# Patient Record
Sex: Female | Born: 1940 | Race: Black or African American | Hispanic: No | Smoking: Never smoker
Health system: Southern US, Community
[De-identification: ages and names within clinical notes are randomized; demographics above are authoritative.]

## PROBLEM LIST (undated history)

## (undated) DIAGNOSIS — I10 Essential (primary) hypertension: Secondary | ICD-10-CM

## (undated) DIAGNOSIS — E7800 Pure hypercholesterolemia, unspecified: Secondary | ICD-10-CM

## (undated) DIAGNOSIS — E785 Hyperlipidemia, unspecified: Secondary | ICD-10-CM

## (undated) DIAGNOSIS — J45909 Unspecified asthma, uncomplicated: Secondary | ICD-10-CM

## (undated) DIAGNOSIS — K219 Gastro-esophageal reflux disease without esophagitis: Secondary | ICD-10-CM

## (undated) DIAGNOSIS — E119 Type 2 diabetes mellitus without complications: Secondary | ICD-10-CM

## (undated) DIAGNOSIS — M1990 Unspecified osteoarthritis, unspecified site: Secondary | ICD-10-CM

## (undated) HISTORY — PX: OTHER SURGICAL HISTORY: SHX169

---

## 2005-04-28 ENCOUNTER — Ambulatory Visit: Payer: Self-pay | Admitting: Family Medicine

## 2007-05-11 ENCOUNTER — Ambulatory Visit: Payer: Self-pay | Admitting: Gastroenterology

## 2007-11-22 ENCOUNTER — Ambulatory Visit: Payer: Self-pay | Admitting: Family Medicine

## 2008-03-02 ENCOUNTER — Ambulatory Visit: Payer: Self-pay | Admitting: Family Medicine

## 2009-03-27 ENCOUNTER — Ambulatory Visit: Payer: Self-pay | Admitting: Family Medicine

## 2011-08-26 ENCOUNTER — Observation Stay: Payer: Self-pay | Admitting: Internal Medicine

## 2011-08-26 LAB — CK TOTAL AND CKMB (NOT AT ARMC)
CK, Total: 61 U/L (ref 21–215)
CK, Total: 46 U/L (ref 21–215)
CK-MB: <0.5 ng/mL — ABNORMAL LOW (ref 0.5–3.6)
CK-MB: <0.5 ng/mL — ABNORMAL LOW (ref 0.5–3.6)

## 2011-08-26 LAB — TSH: Thyroid Stimulating Horm: 2.62 u[IU]/mL

## 2011-08-26 LAB — CBC
HCT: 41 % (ref 35.0–47.0)
HGB: 13.5 g/dL (ref 12.0–16.0)
MCH: 28 pg (ref 26.0–34.0)
MCHC: 33 g/dL (ref 32.0–36.0)
RBC: 4.82 10*6/uL (ref 3.80–5.20)
WBC: 9.3 10*3/uL (ref 3.6–11.0)

## 2011-08-26 LAB — BASIC METABOLIC PANEL
Calcium, Total: 9.1 mg/dL (ref 8.5–10.1)
Chloride: 102 mmol/L (ref 98–107)
Creatinine: 0.66 mg/dL (ref 0.60–1.30)
EGFR (Non-African Amer.): >60
Glucose: 118 mg/dL — ABNORMAL HIGH (ref 65–99)
Osmolality: 279 (ref 275–301)
Potassium: 3.4 mmol/L — ABNORMAL LOW (ref 3.5–5.1)
Sodium: 139 mmol/L (ref 136–145)

## 2011-08-26 LAB — TROPONIN I: Troponin-I: <0.02 ng/mL

## 2011-08-27 LAB — CBC WITH DIFFERENTIAL/PLATELET
Basophil #: 0 10*3/uL (ref 0.0–0.1)
Eosinophil %: 2.1 %
Lymphocyte %: 34.8 %
MCH: 27.8 pg (ref 26.0–34.0)
MCV: 85 fL (ref 80–100)
Monocyte #: 0.6 10*3/uL (ref 0.0–0.7)
Monocyte %: 6.9 %
Neutrophil %: 55.7 %
Platelet: 210 10*3/uL (ref 150–440)
RBC: 4.36 10*6/uL (ref 3.80–5.20)
RDW: 13 % (ref 11.5–14.5)
WBC: 8 10*3/uL (ref 3.6–11.0)

## 2011-08-27 LAB — LIPID PANEL
Cholesterol: 182 mg/dL (ref 0–200)
Ldl Cholesterol, Calc: 124 mg/dL — ABNORMAL HIGH (ref 0–100)

## 2011-08-27 LAB — BASIC METABOLIC PANEL
Anion Gap: 13 (ref 7–16)
Calcium, Total: 9 mg/dL (ref 8.5–10.1)
Co2: 29 mmol/L (ref 21–32)
Creatinine: 0.55 mg/dL — ABNORMAL LOW (ref 0.60–1.30)
EGFR (Non-African Amer.): >60
Glucose: 95 mg/dL (ref 65–99)
Potassium: 3.6 mmol/L (ref 3.5–5.1)
Sodium: 145 mmol/L (ref 136–145)

## 2011-08-27 LAB — TROPONIN I: Troponin-I: <0.02 ng/mL

## 2011-08-27 LAB — CK TOTAL AND CKMB (NOT AT ARMC): CK-MB: <0.5 ng/mL — ABNORMAL LOW (ref 0.5–3.6)

## 2011-08-28 LAB — BASIC METABOLIC PANEL
BUN: 13 mg/dL (ref 7–18)
Calcium, Total: 9.2 mg/dL (ref 8.5–10.1)
Creatinine: 0.62 mg/dL (ref 0.60–1.30)
EGFR (African American): >60
EGFR (Non-African Amer.): >60
Glucose: 133 mg/dL — ABNORMAL HIGH (ref 65–99)
Potassium: 3.6 mmol/L (ref 3.5–5.1)
Sodium: 144 mmol/L (ref 136–145)

## 2011-08-28 LAB — CBC WITH DIFFERENTIAL/PLATELET
Basophil %: 0.4 %
Eosinophil #: 0.2 10*3/uL (ref 0.0–0.7)
Eosinophil %: 2.2 %
HCT: 38 % (ref 35.0–47.0)
HGB: 12.4 g/dL (ref 12.0–16.0)
MCH: 27.8 pg (ref 26.0–34.0)
MCHC: 32.7 g/dL (ref 32.0–36.0)
MCV: 85 fL (ref 80–100)
Monocyte #: 0.6 10*3/uL (ref 0.0–0.7)
Neutrophil #: 4.7 10*3/uL (ref 1.4–6.5)
Neutrophil %: 55.6 %

## 2011-08-28 LAB — PROTIME-INR
INR: 1.1
Prothrombin Time: 14.1 secs (ref 11.5–14.7)

## 2011-08-28 LAB — HEMOGLOBIN A1C: Hemoglobin A1C: 9.1 % — ABNORMAL HIGH (ref 4.2–6.3)

## 2013-08-18 ENCOUNTER — Emergency Department: Payer: Self-pay | Admitting: Emergency Medicine

## 2013-08-18 LAB — COMPREHENSIVE METABOLIC PANEL
ALT: 18 U/L (ref 12–78)
ANION GAP: 6 — AB (ref 7–16)
Albumin: 3.5 g/dL (ref 3.4–5.0)
Alkaline Phosphatase: 103 U/L
BILIRUBIN TOTAL: 0.3 mg/dL (ref 0.2–1.0)
BUN: 12 mg/dL (ref 7–18)
CREATININE: 0.82 mg/dL (ref 0.60–1.30)
Calcium, Total: 8.8 mg/dL (ref 8.5–10.1)
Chloride: 101 mmol/L (ref 98–107)
Co2: 27 mmol/L (ref 21–32)
EGFR (African American): >60
EGFR (Non-African Amer.): >60
Glucose: 374 mg/dL — ABNORMAL HIGH (ref 65–99)
OSMOLALITY: 283 (ref 275–301)
Potassium: 3.8 mmol/L (ref 3.5–5.1)
SGOT(AST): 20 U/L (ref 15–37)
Sodium: 134 mmol/L — ABNORMAL LOW (ref 136–145)
TOTAL PROTEIN: 7.4 g/dL (ref 6.4–8.2)

## 2013-08-18 LAB — CBC
HCT: 42 % (ref 35.0–47.0)
HGB: 14.2 g/dL (ref 12.0–16.0)
MCH: 28 pg (ref 26.0–34.0)
MCHC: 33.8 g/dL (ref 32.0–36.0)
MCV: 83 fL (ref 80–100)
Platelet: 227 10*3/uL (ref 150–440)
RBC: 5.06 10*6/uL (ref 3.80–5.20)
RDW: 12.8 % (ref 11.5–14.5)
WBC: 8.4 10*3/uL (ref 3.6–11.0)

## 2013-12-09 ENCOUNTER — Emergency Department: Payer: Self-pay | Admitting: Emergency Medicine

## 2014-02-06 DIAGNOSIS — I10 Essential (primary) hypertension: Secondary | ICD-10-CM | POA: Insufficient documentation

## 2014-07-29 ENCOUNTER — Emergency Department: Payer: Self-pay | Admitting: Student

## 2014-08-09 DIAGNOSIS — N611 Abscess of the breast and nipple: Secondary | ICD-10-CM | POA: Insufficient documentation

## 2014-12-03 NOTE — H&P (Signed)
PATIENT NAME:  Mary Hodges, Mary Hodges MR#:  [id number] DATE OF BIRTH:  June 22, 1941  DATE OF ADMISSION:  08/26/2011  PRIMARY MD: Alonna Buckler, MD   ED REFERRING DOCTOR: Dr. Marilynne Halsted   CHIEF COMPLAINT: Chest pressure, shortness of breath.   HISTORY OF PRESENT ILLNESS: The patient is a 74 year old African American female with history of asthma, hypertension, type II diabetes, GERD, and history of hyperlipidemia who states that she started having nausea and vomiting since last night and developed chest tightness since this morning. She reports that initially it was pretty severe. She could not breathe, had to use her inhaler. The chest tightness continued. Inhaler did help with her breathing. She came to the ED. In the ER, the patient's EKG does not show any acute changes. Her cardiac enzymes are negative. She also reports that she has had feeling of having fluttering in her chest that started also this morning. Again, we have not seen anything on the telemetry to suggest any type of arrhythmia. She has not had any radiation of the pain. She does have chronic lower extremity swelling. She reports that over the last two years she has had to keep her head propped up at nighttime because if she slept without her head being propped she would get short of breath. She also has a history of reflux and that she reports that the reflux was causing her to choke and that was another reason that she keeps her bed propped up. She also complains of swelling. In the ED, she had a chest x-ray which suggested possible interstitial edema. She also was nauseated and had vomiting. She reports multiple episodes since yesterday. No diarrhea. Has not had any sick contacts or any unusual food consumption. She, otherwise, denies any abdominal pain. No hematemesis. No hematochezia. Denies any urinary frequency, urgency, or hesitancy.   PAST MEDICAL HISTORY:  1. Asthma.  2. Hypertension.  3. Type II diabetes.  4. Gastroesophageal reflux disease.   5. History of hyperlipidemia, not on any treatment.   PAST SURGICAL HISTORY: Left toe surgery.   ALLERGIES: None.   CURRENT MEDICATIONS:  1. Amlodipine 10 daily.  2. Aspirin 81 1 tab daily.  3. Calcium citrate daily.  4. Glyburide/metformin 5/500 1 tab p.o. b.i.d.  5. Hydrochlorothiazide/losartan 25/160 daily.  6. Januvia 100 daily. 7. Levemir FlexPen subcutaneous. 8. Metformin 1000 1 tab p.o. b.i.d.  9. Nexium 40 daily.  10. Orphenadrine 100 mg 1 tab p.o. b.i.d.  11. Vitamin B complex 100 daily.  12. Albuterol MDI 2 to 4 puffs as needed.   SOCIAL HISTORY: No smoking. No alcohol. No drugs.   FAMILY HISTORY: Father with coronary artery disease. Mother with cerebrovascular accident.   REVIEW OF SYSTEMS: CONSTITUTIONAL: Denies any fevers. Complains of some fatigue and weakness. Denies any pain. No weight loss. No weight gain. EYES: No blurred or double vision. No pain. No redness. No inflammation. No glaucoma or cataracts. ENT: No tinnitus. No ear pain. No hearing loss. No allergies, seasonal or year-round. No postnasal drip. No sinus pain. No difficulty with swallowing. RESPIRATORY: Does have dry cough, intermittent wheezing. No hemoptysis. Has dyspnea on exertion. No asthma. No painful respirations. No chronic obstructive pulmonary disease. No TB. No pneumonia. CARDIOVASCULAR: Chest pressure as above. Endorses some symptoms of orthopnea. Has edema. No arrhythmias but complains of irregular heartbeat since today with fluttering feeling. No syncope. Has high blood pressure. GI: Nausea and vomiting since yesterday has improved. No abdominal pain. No hematemesis. No melena. No ulcer. Does have  gastroesophageal reflux disease. No irritable bowel syndrome. No jaundice. No changes in bowel habits. GU: Denies any dysuria, hematuria, renal calculi, frequency, or incontinence. ENDOCRINE: Denies any polyuria, nocturia, or thyroid problems. HEME/LYMPH: Denies anemia, easy bruisability or bleeding. SKIN:  No acne. No rash. No changes in mole, hair or skin. MUSCULOSKELETAL: No pain in neck, back or shoulder. NEUROLOGIC: No numbness. No cerebrovascular accident. No transient ischemic attack. No seizures. PSYCHIATRIC: No anxiety. No insomnia. No ADD.    PHYSICAL EXAMINATION:   VITAL SIGNS: Temperature 97.5, pulse 92, respiratory rate 20, blood pressure 185/67 on presentation, 99% on room air.   GENERAL: The patient is a morbidly obese African American female currently not in any acute distress.   HEENT: Head atraumatic, normocephalic. Pupils equal, round, reactive to light and accommodation. Extraocular movements intact. There is no scleral icterus. Conjunctivae pallor. Oropharynx is clear without any exudates. Ear exam shows no drainage or erythema. Nasal exam shows no drainage or ulceration.   NECK: There is no thyromegaly. Unable to appreciate JVD due to her thick neck.   CARDIOVASCULAR: Regular rate and rhythm. No murmurs, rubs, clicks, or gallops. PMI is not displaced.   LUNGS: Diminished breath sounds. I do not appreciate any crackles, rales, or wheezing.   ABDOMEN: Obese, soft, nontender, nondistended. Positive bowel sounds x4. There is no hepatosplenomegaly.   EXTREMITIES: She has 1+ edema.   SKIN: There is no rash.   LYMPHATICS: No lymph nodes palpable.   MUSCULOSKELETAL: There is no erythema or swelling.   NEUROLOGICAL: Awake, alert, oriented x3. No focal deficits.   VASCULAR: Good DP, PT pulses.   LYMPH: No lymph nodes palpable.   PSYCHIATRIC: Not anxious or depressed.   EVALUATIONS IN THE ED: WBC 9.3, hemoglobin 13.5, platelets 235, glucose 118, BUN 13, creatinine 0.66, sodium 139, potassium slightly low at 3.4, chloride 102. Troponin less than 0.02. CPK 61. CK-MB less than 0.05. Chest x-ray shows possible bilateral interstitial edema. EKG shows normal sinus rhythm without any ST-T wave changes   ASSESSMENT AND PLAN: The patient is a 74 year old African American female  with history of hypertension, diabetes, and hyperlipidemia who presents with chest pressure since this a.m., heart skipping beats, fluttering feeling, and shortness of breath.  1. Chest pressure associated with palpitations, possibly cardiac. The patient does have multiple risk factors. Will go ahead and get a Lexi MIBI in the a.m. Will check a D-dimer. Check serial enzymes. Place her on aspirin. I will start her on Lopressor b.i.d. I'll have Cardiology come and evaluate the patient in the morning.  2. Shortness of breath possibly due to CHF based on chest x-ray. At this time will order a BNP. Will place her on low dose IV Lasix. Will get echocardiogram and do a PA and lateral chest x-ray in the a.m. Follow ins and outs.  3. Nausea and vomiting possibly related to #1, possible gastroenteritis. Will monitor for symptoms.  4. Diabetes type II. Continue sliding scale insulin, Levemir, and p.o. medications.  5. Hypertension. Will continue her Diovan. Hold HCTZ in light of the Lasix. I will start her on metoprolol. Will hold amlodipine for the time being.  6. Will place her on Lovenox for DVT prophylaxis.   TIME SPENT: Please note 40 minutes spent on this H and P.  ____________________________ Lacie ScottsShreyang H. Allena KatzPatel, MD shp:drc D: 08/26/2011 16:37:49 ET T: 08/26/2011 17:04:05 ET JOB#: [id number]  cc: Lindel Marcell H. Allena KatzPatel, MD, <Dictator> Reola MosherAndrew S. Randa LynnLamb, MD Charise CarwinSHREYANG H Margot Oriordan MD ELECTRONICALLY SIGNED 09/13/2011 8:14

## 2014-12-03 NOTE — Discharge Summary (Signed)
PATIENT NAME:  Mary Hodges, Mary Hodges MR#:  [id number] DATE OF BIRTH:  March 23, 1941  DATE OF ADMISSION:  08/27/2011 DATE OF DISCHARGE:  08/28/2011  ADMITTING DIAGNOSIS: Chest pain.   DISCHARGE DIAGNOSES:  1. Chest pain, status post Myoview revealing apical ischemia. Normal echocardiogram status post cardiac catheterization 08/28/2011 by Dr. Juliann Pares, which showed minimal coronary artery disease, normal renal arteries.  2. Dyspnea, resolved.  3. Diabetes mellitus. 4. Hypertension.   DISCHARGE CONDITION: Stable.   DISCHARGE MEDICATIONS: The patient is to resume her outpatient medications which are:  1. Amlodipine 10 mg p.o. daily.  2. Januvia 100 mg p.o. daily.  3. Aspirin 81 mg p.o. daily.  4. Nexium 40 mg p.o. daily.  5. Vitamin D complex once daily.  6. Metformin 1 gram twice daily. The patient, however, is to hold all medications with metformin for 48 hours after cardiac catheterization procedure, then resume at regular doses. She is to resume her metformin in 48 hours at 1 gram twice daily.  7. Glyburide metformin 5/500 1 tablet twice daily.  8. Orphenadrine 100 mg p.o. twice daily.  9. Hydrochlorothiazide losartan 25/160 mg p.o. once daily.  10. Calcium citrate orally, unknown dose or frequency.  11. Levemir unknown frequency, unknown dose. Previously prescribed doses, no changes here.  As mentioned above, the patient is not to take all medications with metformin for 48 hours after cardiac catheterization procedure. Then she is to resume her medications at regular doses.  HOME OXYGEN: None.   DIET: 1800 ADA, low fat, low cholesterol, 2 grams salt.   PHYSICAL ACTIVITY LIMITATIONS: As tolerated.   FOLLOWUP: 1. Follow-up appointment with Dr. Juliann Pares two days after discharge.  2. Follow-up appointment Thad Ranger, NP, [facility], two days after discharge.  CONSULTANTS:  Dr. Juliann Pares.   RADIOLOGIC STUDIES:  1. Chest x-ray portable single view 08/26/2011: Findings suggesting mild  interstitial pulmonary edema. Follow-up PA and lateral would be suggested. 2. Myoview nuclear study on 08/27/2011: Adequate LexiScan study. Evidence of anterior apical ischemia with depressed left ventricular function of 40%. Recommend further evaluation if clinical presentation suggests. This study is suggestive of ischemia according to Dr. Juliann Pares.  3. Cardiac catheterization results 08/28/2011: Minimal coronary artery disease, normal renals. Cardiac catheterization was done with bilateral renals for poorly controlled hypertension   HISTORY OF PRESENT ILLNESS: The patient is a 74 year old female with past medical history significant for history of diabetes, hypertension, and hyperlipidemia who presented to the hospital with complaints of chest pains as well as shortness of breath. Please refer to Dr. Eliane Decree admission note on the 08/26/2011.  On arrival to the Emergency Room, the patient's temperature was 97.5, pulse 92, respiratory rate 20, blood pressure 185/67, saturation was 99% on oxygen therapy.  Physical exam was unremarkable except for diminished breath sounds, but no appreciated crackles, rales, or wheezing.  She had 1+ lower extremity edema. Chest x-ray was concerning for mild fluid retention.  LABORATORY DATA:  08/26/2011: Glucose 118, beta type natriuretic peptide was 227, potassium was 3.4, otherwise unremarkable BMP. The patient's cardiac enzymes, first set as well as subsequent two more sets, were within normal limits. TSH was normal at 2.62. CBC was within normal limits. D-dimer was slightly elevated at 0.52. EKG showed normal sinus rhythm at a rate of 94. Abnormal EKG with possible anterior infarct, age undetermined.   HOSPITAL COURSE: The patient was admitted to the hospital as mentioned above. Cardiac enzymes were cycled and Myoview stress test was performed. Unfortunately, the stress test was concerning for possible  apical ischemia and cardiac catheterization was recommended by Dr.  Juliann Paresallwood. The patient underwent cardiac catheterization on 08/28/2011 and Dr. Juliann Paresallwood reported it to be unremarkable. The patient apparently had very minimal coronary artery disease as well as normal renal arteries, which were checked to evaluate patient for elevated blood pressure. It was felt by Dr. Juliann Paresallwood that the patient's chest pain was unlikely cardiac and discharge was recommended.  The reason for the chest pain remained unclear. However, it was postulated that it could have been that the patient had some fluid overload versus pain due to her significantly elevated blood pressure. The patient was given Lasix one dose while in the hospital and was continued on her usual outpatient medications. On the day of discharge, 08/28/2011, the patient's vital signs revealed satisfactory control of the blood pressure. Temperature was normal, 97.8, pulse 62, respirations 18, blood pressure 148/68, however was fluctuating between 130s and 150s. It is recommended to check the patient's blood pressure readings as outpatient and initiate, change, or advanced her blood pressure medications as necessary. The patient's pulse oximetry remained stable 94% to 97% on room air at rest.  In regards to diabetes, the patient is to continue her outpatient medications. The patient's hemoglobin A1c was checked and was found to be markedly elevated at 9.1 and the patient was advised to stick closely to low-fat, low-cholesterol, 1800 ADA diet. No changes in medications were made at this point because of the patient's very short stay in the hospital and unfortunately late knowledge of the patient's cardiac catheterization results.   In regards to hypertension, as mentioned above the patient's blood pressure readings were very high when she first came in. It was felt initially that it was stress related. However, the patient's blood pressure intermittently was elevated to 150s in the hospital as well.  Her medications were not changed.  However, if the patient's blood pressure remains somewhat elevated more than desirable, it is recommended to advance the patient's medications as necessary, keeping systolic blood pressure below 161130, preferably below 120 for this patient with diabetes.   The patient is being discharged in stable condition. She is to follow up with cardiologist Dr. Juliann Paresallwood and her primary care physician Dr. Randa LynnLamb in the next few days after discharge.   TIME SPENT: 40 minutes.  ____________________________ Katharina Caperima Deakon Frix, MD rv:bjt D: 08/28/2011 19:41:46 ET T: 08/29/2011 09:49:40 ET JOB#: [id number]  cc: Reola MosherAndrew S. Randa LynnLamb, MD Dwayne D. Juliann Paresallwood, MD Katharina CaperIMA Roberto Romanoski MD ELECTRONICALLY SIGNED 09/21/2011 20:13

## 2014-12-03 NOTE — Consult Note (Signed)
PATIENT NAME:  Mary Hodges, Mary Hodges MR#:  [id number] DATE OF BIRTH:  04-Jul-1941  DATE OF CONSULTATION:  08/27/2011  REFERRING PHYSICIAN:  Alonna Buckler, MD CONSULTING PHYSICIAN:  Larenzo Caples D. Juliann Pares, MD  EMERGENCY ROOM PHYSICIAN: Dr. Marilynne Halsted  [facility]: Dr. Allena Katz  INDICATION: Angina, shortness of breath.   HISTORY OF PRESENT ILLNESS: Ms. Mary Hodges is a 74 year old black female with history of asthma, hypertension, diabetes, gastroesophageal reflux disease, and hyperlipidemia. She states she has been having some nausea and vomiting over the last 24 hours and developed some chest tightness the morning prior to admission. She states initially the pain was pretty severe. She had trouble breathing. She has shortness of breath. She tried to use her inhalers, but the chest tightness continued and the inhaler did not help which prompted her to come to the Emergency Room since the chest pain was substernal. In the ER, the patient had and EKG done which was unremarkable. Cardiac enzymes were negative. She was reportedly feeling heart fluttering and palpitations in her chest.  She has done reasonably. She did not have any radiation of the pain. She does have chronic lower leg swelling, over the last 2 to 3 years. She has been keeping her feet propped up at night to help with the swelling. She has a history of reflux causing choking and coughing and nausea at times with some vomiting. She has multiple episodes of that. She has had no recent diarrhea, no recent sick contacts, and no prior cardiac history, but with her persistent recurrent chest pain, she came to Emergency Room and now is being admitted for further evaluation and care requiring cardiology evaluation.   REVIEW OF SYSTEMS: No blackout spells or syncope. She has had nausea and vomiting. She has had no fever, no chills, and no sweats. She denies weight loss and no weight gain. No hemoptysis or hematemesis. No bright red blood per rectum. She has had evidence of  weakness, fatigue, shortness of breath, and leg edema.   PAST MEDICAL HISTORY:  1. Asthma. 2. Hypertension. 3. Diabetes. 4. Reflux.  5. Hyperlipidemia.   PAST SURGICAL HISTORY: Left toe surgery.   DRUG ALLERGIES: No known drug allergies.   MEDICATIONS:  1. Amlodipine 10 mg a day.  2. Aspirin 81 mg a day. 3. Calcium citrate daily.  4. Glyburide/metformin 5/500 mg twice a day.  5. HCTZ/losartan 25/160 mg daily. 6. Januvia 100 mg daily. 7. Levemir flex pen subcutaneously. 8. Metformin 1000 mg twice a day.  9. Nexium 40 mg a day.  10. Orphenadrine 100 mg twice a day.  11. Vitamin B complex 100 mg a day.  12. Albuterol metered-dose inhaler 2 to 4 puffs p.r.n.   SOCIAL HISTORY: No smoking. No alcohol consumption. Retired. Widowed.   FAMILY HISTORY: Coronary artery disease and cerebrovascular accident.   PHYSICAL EXAMINATION:   VITAL SIGNS: Blood pressure 190/70, pulse 90, respiratory rate 20, and afebrile.   HEENT: Normocephalic, atraumatic. Pupils are equal and reactive to light.   NECK: Supple. No jugular venous distention, bruits, or adenopathy.   LUNGS: Diminished breath sounds. No wheezing, rhonchi, or rales.   HEART: Regular rate and rhythm. Positive S4. Systolic ejection murmur at the apex. PMI is nondisplaced.   ABDOMEN: Benign. Positive bowel sounds. No rebound, guarding, or tenderness.  She has bruits in the right lower abdomen.  EXTREMITIES: 1 to 2+ edema. No cyanosis or clubbing.  NEUROLOGIC: Intact.   SKIN: Normal.   LABS/STUDIES: White count 93, hemoglobin 13.5, platelet count 235. Glucose  118. BUN 13, creatinine 0.66, sodium 139, potassium 3.4, chloride 102. Troponin less than 0.02. CK 61 and CPK/MB less than 0.05.   Chest x-ray: Possible bilateral interstitial edema.   EKG: Normal sinus rhythm, nonspecific ST changes.   ASSESSMENT:  1. Possible angina. 2. Malignant hypertension. 3. Congestive heart failure. 4. Shortness of  breath. 5. Diabetes. 6. Hypertension. 7. Obesity. 8. Gastroesophageal reflux disease. 9. Nausea and vomiting. 10. Edema.   PLAN: I recommend further evaluation and rule out for myocardial infarction. Follow cardiac enzymes. Continue blood pressure control. Probably institute beta-blockade, if she can tolerate. If nit add calcium blocker like verapamil or Cardizem, but hopefully low dose beta blockers will be helpful. Statin therapy. Continue diabetes management. Reflux therapy. Continue inhalers for shortness of breath. Consider Lasix for heart failure as well as edema. Would be more aggressive with blood pressure management because of malignant hypertension. Eventually, because of      bruits and malignant hypertension, evaluate for renal artery stenosis. Echocardiogram may be helpful. Consider cardiac work-up with a functional study and base further evaluation on the results of the functional study  ____________________________ Bobbie Stackwayne D. Juliann Paresallwood, MD ddc:slb D: 09/11/2011 21:58:00 ET T: 09/12/2011 11:19:21 ET JOB#: [id number]  cc: Shakari Qazi D. Juliann Paresallwood, MD, <Dictator> Alwyn PeaWAYNE D Myrl Lazarus MD ELECTRONICALLY SIGNED 09/30/2011 10:26

## 2015-09-05 ENCOUNTER — Emergency Department: Payer: Medicare Other

## 2015-09-05 ENCOUNTER — Observation Stay
Admission: EM | Admit: 2015-09-05 | Discharge: 2015-09-07 | Disposition: A | Discharge status: Home / Self Care | Payer: Medicare Other | Attending: Internal Medicine | Admitting: Internal Medicine

## 2015-09-05 ENCOUNTER — Encounter: Payer: Self-pay | Admitting: *Deleted

## 2015-09-05 DIAGNOSIS — J45909 Unspecified asthma, uncomplicated: Secondary | ICD-10-CM | POA: Diagnosis not present

## 2015-09-05 DIAGNOSIS — I351 Nonrheumatic aortic (valve) insufficiency: Secondary | ICD-10-CM | POA: Diagnosis not present

## 2015-09-05 DIAGNOSIS — Z794 Long term (current) use of insulin: Secondary | ICD-10-CM | POA: Insufficient documentation

## 2015-09-05 DIAGNOSIS — I059 Rheumatic mitral valve disease, unspecified: Secondary | ICD-10-CM | POA: Insufficient documentation

## 2015-09-05 DIAGNOSIS — I10 Essential (primary) hypertension: Secondary | ICD-10-CM | POA: Diagnosis present

## 2015-09-05 DIAGNOSIS — M25562 Pain in left knee: Secondary | ICD-10-CM | POA: Insufficient documentation

## 2015-09-05 DIAGNOSIS — E1169 Type 2 diabetes mellitus with other specified complication: Secondary | ICD-10-CM

## 2015-09-05 DIAGNOSIS — K219 Gastro-esophageal reflux disease without esophagitis: Secondary | ICD-10-CM | POA: Diagnosis present

## 2015-09-05 DIAGNOSIS — Z823 Family history of stroke: Secondary | ICD-10-CM | POA: Insufficient documentation

## 2015-09-05 DIAGNOSIS — R55 Syncope and collapse: Principal | ICD-10-CM | POA: Diagnosis present

## 2015-09-05 DIAGNOSIS — I071 Rheumatic tricuspid insufficiency: Secondary | ICD-10-CM | POA: Diagnosis not present

## 2015-09-05 DIAGNOSIS — E785 Hyperlipidemia, unspecified: Secondary | ICD-10-CM | POA: Diagnosis present

## 2015-09-05 DIAGNOSIS — R531 Weakness: Secondary | ICD-10-CM | POA: Insufficient documentation

## 2015-09-05 DIAGNOSIS — E119 Type 2 diabetes mellitus without complications: Secondary | ICD-10-CM

## 2015-09-05 DIAGNOSIS — Z7982 Long term (current) use of aspirin: Secondary | ICD-10-CM | POA: Insufficient documentation

## 2015-09-05 DIAGNOSIS — M1990 Unspecified osteoarthritis, unspecified site: Secondary | ICD-10-CM | POA: Diagnosis not present

## 2015-09-05 DIAGNOSIS — Z8249 Family history of ischemic heart disease and other diseases of the circulatory system: Secondary | ICD-10-CM | POA: Insufficient documentation

## 2015-09-05 HISTORY — DX: Essential (primary) hypertension: I10

## 2015-09-05 HISTORY — DX: Unspecified asthma, uncomplicated: J45.909

## 2015-09-05 HISTORY — DX: Gastro-esophageal reflux disease without esophagitis: K21.9

## 2015-09-05 HISTORY — DX: Unspecified osteoarthritis, unspecified site: M19.90

## 2015-09-05 HISTORY — DX: Hyperlipidemia, unspecified: E78.5

## 2015-09-05 HISTORY — DX: Type 2 diabetes mellitus without complications: E11.9

## 2015-09-05 LAB — BASIC METABOLIC PANEL
Anion gap: 9 (ref 5–15)
BUN: 11 mg/dL (ref 6–20)
CHLORIDE: 104 mmol/L (ref 101–111)
CO2: 26 mmol/L (ref 22–32)
CREATININE: 0.71 mg/dL (ref 0.44–1.00)
Calcium: 9.2 mg/dL (ref 8.9–10.3)
GFR calc Af Amer: >60 mL/min (ref >60)
GFR calc non Af Amer: >60 mL/min (ref >60)
Glucose, Bld: 293 mg/dL — ABNORMAL HIGH (ref 65–99)
Potassium: 3.5 mmol/L (ref 3.5–5.1)
SODIUM: 139 mmol/L (ref 135–145)

## 2015-09-05 LAB — CBC
HCT: 40.6 % (ref 35.0–47.0)
Hemoglobin: 13.5 g/dL (ref 12.0–16.0)
MCH: 26.6 pg (ref 26.0–34.0)
MCHC: 33.3 g/dL (ref 32.0–36.0)
MCV: 80.1 fL (ref 80.0–100.0)
PLATELETS: 226 10*3/uL (ref 150–440)
RBC: 5.07 MIL/uL (ref 3.80–5.20)
RDW: 13 % (ref 11.5–14.5)
WBC: 10.4 10*3/uL (ref 3.6–11.0)

## 2015-09-05 LAB — URINALYSIS COMPLETE WITH MICROSCOPIC (ARMC ONLY)
BILIRUBIN URINE: NEGATIVE
Bacteria, UA: NONE SEEN
Hgb urine dipstick: NEGATIVE
Nitrite: NEGATIVE
Protein, ur: NEGATIVE mg/dL
Specific Gravity, Urine: 1.031 — ABNORMAL HIGH (ref 1.005–1.030)
pH: 5 (ref 5.0–8.0)

## 2015-09-05 LAB — TROPONIN I: Troponin I: <0.03 ng/mL (ref <0.031)

## 2015-09-05 LAB — GLUCOSE, CAPILLARY: GLUCOSE-CAPILLARY: 271 mg/dL — AB (ref 65–99)

## 2015-09-05 NOTE — ED Notes (Addendum)
Pt to triage via wheelchair.  Pt had syncopal episode.  Pt fell today and does not remember falling.  Pt has abrasion to right 4th finger.  Bleeding controlled.  Pt is diabetic.  No insulin for 2 days because she ran out of it.  No chest pain or sob.  Pt has weakness all over. Pt has left knee pain.  Swelling noted.   Pt alert.  Speech clear.  Skin warm and dry.

## 2015-09-06 ENCOUNTER — Observation Stay
Admit: 2015-09-06 | Discharge: 2015-09-06 | Disposition: A | Discharge status: Home / Self Care | Payer: Medicare Other | Attending: Internal Medicine | Admitting: Internal Medicine

## 2015-09-06 ENCOUNTER — Encounter: Payer: Self-pay | Admitting: Internal Medicine

## 2015-09-06 ENCOUNTER — Emergency Department: Payer: Medicare Other

## 2015-09-06 DIAGNOSIS — K219 Gastro-esophageal reflux disease without esophagitis: Secondary | ICD-10-CM | POA: Diagnosis present

## 2015-09-06 DIAGNOSIS — E119 Type 2 diabetes mellitus without complications: Secondary | ICD-10-CM

## 2015-09-06 DIAGNOSIS — E785 Hyperlipidemia, unspecified: Secondary | ICD-10-CM | POA: Diagnosis present

## 2015-09-06 DIAGNOSIS — E1169 Type 2 diabetes mellitus with other specified complication: Secondary | ICD-10-CM

## 2015-09-06 DIAGNOSIS — R55 Syncope and collapse: Secondary | ICD-10-CM | POA: Diagnosis present

## 2015-09-06 DIAGNOSIS — I10 Essential (primary) hypertension: Secondary | ICD-10-CM | POA: Diagnosis present

## 2015-09-06 LAB — GLUCOSE, CAPILLARY
Glucose-Capillary: 193 mg/dL — ABNORMAL HIGH (ref 65–99)
Glucose-Capillary: 295 mg/dL — ABNORMAL HIGH (ref 65–99)
Glucose-Capillary: 233 mg/dL — ABNORMAL HIGH (ref 65–99)

## 2015-09-06 LAB — BASIC METABOLIC PANEL WITH GFR
Anion gap: 9 (ref 5–15)
BUN: 8 mg/dL (ref 6–20)
CO2: 23 mmol/L (ref 22–32)
Calcium: 8.7 mg/dL — ABNORMAL LOW (ref 8.9–10.3)
Chloride: 107 mmol/L (ref 101–111)
Creatinine, Ser: 0.5 mg/dL (ref 0.44–1.00)
GFR calc Af Amer: >60 mL/min
GFR calc non Af Amer: >60 mL/min
Glucose, Bld: 322 mg/dL — ABNORMAL HIGH (ref 65–99)
Potassium: 3.7 mmol/L (ref 3.5–5.1)
Sodium: 139 mmol/L (ref 135–145)

## 2015-09-06 LAB — TROPONIN I
Troponin I: <0.03 ng/mL
Troponin I: <0.03 ng/mL (ref <0.031)

## 2015-09-06 LAB — CBC
HCT: 39 % (ref 35.0–47.0)
Hemoglobin: 13 g/dL (ref 12.0–16.0)
MCH: 26.6 pg (ref 26.0–34.0)
MCHC: 33.3 g/dL (ref 32.0–36.0)
MCV: 79.7 fL — ABNORMAL LOW (ref 80.0–100.0)
Platelets: 206 10*3/uL (ref 150–440)
RBC: 4.89 MIL/uL (ref 3.80–5.20)
RDW: 13.1 % (ref 11.5–14.5)
WBC: 7.6 10*3/uL (ref 3.6–11.0)

## 2015-09-06 MED ORDER — SODIUM CHLORIDE 0.9% FLUSH
3.0000 mL | Freq: Two times a day (BID) | INTRAVENOUS | Status: DC
  Administered 2015-09-06 (×2): 3 mL via INTRAVENOUS

## 2015-09-06 MED ORDER — PRAVASTATIN SODIUM 20 MG PO TABS
20.0000 mg | ORAL_TABLET | Freq: Every day | ORAL | Status: DC
  Administered 2015-09-06: 20 mg via ORAL
  Filled 2015-09-06: qty 1

## 2015-09-06 MED ORDER — ALBUTEROL SULFATE (2.5 MG/3ML) 0.083% IN NEBU: 2.5000 mg | INHALATION_SOLUTION | Freq: Four times a day (QID) | RESPIRATORY_TRACT | Status: DC | PRN

## 2015-09-06 MED ORDER — ONDANSETRON HCL 4 MG/2ML IJ SOLN: 4.0000 mg | Freq: Four times a day (QID) | INTRAMUSCULAR | Status: DC | PRN

## 2015-09-06 MED ORDER — ONDANSETRON HCL 4 MG PO TABS: 4.0000 mg | ORAL_TABLET | Freq: Four times a day (QID) | ORAL | Status: DC | PRN

## 2015-09-06 MED ORDER — IRBESARTAN 150 MG PO TABS
150.0000 mg | ORAL_TABLET | Freq: Every day | ORAL | Status: DC
  Administered 2015-09-06 – 2015-09-07 (×2): 150 mg via ORAL
  Filled 2015-09-06 (×2): qty 1

## 2015-09-06 MED ORDER — INSULIN DETEMIR 100 UNIT/ML FLEXPEN: 20.0000 [IU] | PEN_INJECTOR | Freq: Every day | SUBCUTANEOUS | Status: DC

## 2015-09-06 MED ORDER — VALSARTAN-HYDROCHLOROTHIAZIDE 160-25 MG PO TABS: 1.0000 | ORAL_TABLET | Freq: Every day | ORAL | Status: DC

## 2015-09-06 MED ORDER — ACETAMINOPHEN 325 MG PO TABS: 650.0000 mg | ORAL_TABLET | Freq: Four times a day (QID) | ORAL | Status: DC | PRN

## 2015-09-06 MED ORDER — ALBUTEROL SULFATE HFA 108 (90 BASE) MCG/ACT IN AERS: 2.0000 | INHALATION_SPRAY | Freq: Four times a day (QID) | RESPIRATORY_TRACT | Status: DC | PRN

## 2015-09-06 MED ORDER — HYDROCHLOROTHIAZIDE 25 MG PO TABS
25.0000 mg | ORAL_TABLET | Freq: Every day | ORAL | Status: DC
  Administered 2015-09-06 – 2015-09-07 (×2): 25 mg via ORAL
  Filled 2015-09-06 (×2): qty 1

## 2015-09-06 MED ORDER — ACETAMINOPHEN 650 MG RE SUPP: 650.0000 mg | Freq: Four times a day (QID) | RECTAL | Status: DC | PRN

## 2015-09-06 MED ORDER — SODIUM CHLORIDE 0.9 % IV BOLUS (SEPSIS)
1000.0000 mL | Freq: Once | INTRAVENOUS | Status: AC
  Administered 2015-09-06: 1000 mL via INTRAVENOUS

## 2015-09-06 MED ORDER — PANTOPRAZOLE SODIUM 40 MG PO TBEC
40.0000 mg | DELAYED_RELEASE_TABLET | Freq: Every day | ORAL | Status: DC
  Administered 2015-09-06: 40 mg via ORAL
  Filled 2015-09-06: qty 1

## 2015-09-06 MED ORDER — INSULIN ASPART 100 UNIT/ML ~~LOC~~ SOLN
0.0000 [IU] | Freq: Every day | SUBCUTANEOUS | Status: DC
  Administered 2015-09-06: 2 [IU] via SUBCUTANEOUS
  Filled 2015-09-06: qty 2

## 2015-09-06 MED ORDER — ASPIRIN EC 81 MG PO TBEC
81.0000 mg | DELAYED_RELEASE_TABLET | Freq: Every day | ORAL | Status: DC
  Administered 2015-09-06 – 2015-09-07 (×2): 81 mg via ORAL
  Filled 2015-09-06 (×2): qty 1

## 2015-09-06 MED ORDER — ENOXAPARIN SODIUM 40 MG/0.4ML ~~LOC~~ SOLN
40.0000 mg | Freq: Two times a day (BID) | SUBCUTANEOUS | Status: DC
  Administered 2015-09-06 (×2): 40 mg via SUBCUTANEOUS
  Filled 2015-09-06 (×3): qty 0.4

## 2015-09-06 MED ORDER — AMLODIPINE BESYLATE 10 MG PO TABS
10.0000 mg | ORAL_TABLET | Freq: Every day | ORAL | Status: DC
  Administered 2015-09-06 – 2015-09-07 (×2): 10 mg via ORAL
  Filled 2015-09-06: qty 2
  Filled 2015-09-06: qty 1

## 2015-09-06 MED ORDER — HYDROCODONE-ACETAMINOPHEN 5-325 MG PO TABS
1.0000 | ORAL_TABLET | ORAL | Status: DC | PRN
  Administered 2015-09-06 (×2): 1 via ORAL
  Filled 2015-09-06: qty 1
  Filled 2015-09-06: qty 2
  Filled 2015-09-06: qty 1

## 2015-09-06 MED ORDER — INSULIN DETEMIR 100 UNIT/ML ~~LOC~~ SOLN
20.0000 [IU] | Freq: Every day | SUBCUTANEOUS | Status: DC
  Administered 2015-09-06: 20 [IU] via SUBCUTANEOUS
  Filled 2015-09-06 (×3): qty 0.2

## 2015-09-06 MED ORDER — INSULIN ASPART 100 UNIT/ML ~~LOC~~ SOLN
0.0000 [IU] | Freq: Three times a day (TID) | SUBCUTANEOUS | Status: DC
  Administered 2015-09-06: 3 [IU] via SUBCUTANEOUS
  Administered 2015-09-06: 2 [IU] via SUBCUTANEOUS
  Administered 2015-09-07: 3 [IU] via SUBCUTANEOUS
  Filled 2015-09-06: qty 3
  Filled 2015-09-06: qty 2
  Filled 2015-09-06: qty 3

## 2015-09-06 NOTE — ED Notes (Signed)
Pt assisted to bedside commode and back to bed at this time.

## 2015-09-06 NOTE — ED Notes (Signed)
Pt went to CT

## 2015-09-06 NOTE — ED Provider Notes (Signed)
[facility] Emergency Department Provider Note  ____________________________________________  Time seen: 11:45 PM  I have reviewed the triage vital signs and the nursing notes.   HISTORY  Chief Complaint Weakness     HPI Mary Hodges is a 75 y.o. female presents with history of syncopal episode while at home today. Patient states that she does not recall any symptoms before passing out. Patient denies any preceding chest pain shortness of breath dizziness, palpitation headache weakness or numbness. Following the event the patient states that she just "felt tired". Patient denies any discomfort at this time no headache no nausea vomiting chest pain or palpitations. Patient denies any previous episodes of syncope. In addition patient admits to "running out of her insulin 2 days ago". Patient also takes metformin for her diabetes.     Past medical history Diabetes mellitus There are no active problems to display for this patient.  Past surgical history None No current outpatient prescriptions on file.  Allergies No known drug allergies No family history on file.  Social History Social History  Substance Use Topics  . Smoking status: Never Smoker   . Smokeless tobacco: Not on file  . Alcohol Use: No    Review of Systems  Constitutional: Negative for fever. Eyes: Negative for visual changes. ENT: Negative for sore throat. Cardiovascular: Negative for chest pain. Respiratory: Negative for shortness of breath. Gastrointestinal: Negative for abdominal pain, vomiting and diarrhea. Genitourinary: Negative for dysuria. Musculoskeletal: Negative for back pain. Skin: Negative for rash. Neurological: Positive for syncope   10-point ROS otherwise negative.  ____________________________________________   PHYSICAL EXAM:  VITAL SIGNS: ED Triage Vitals  Enc Vitals Group     BP 09/05/15 2118 179/54 mmHg     Pulse Rate 09/05/15 2118 69     Resp  09/05/15 2118 18     Temp 09/05/15 2118 98.2 F (36.8 C)     Temp Source 09/05/15 2118 Oral     SpO2 09/05/15 2118 97 %     Weight 09/05/15 2118 245 lb (111.131 kg)     Height 09/05/15 2118  (1.626 m)     Head Cir --      Peak Flow --      Pain Score 09/05/15 2137 10     Pain Loc --      Pain Edu? --      Excl. in GC? --     Constitutional: Alert and oriented. Well appearing and in no distress. Eyes: Conjunctivae are normal. PERRL. Normal extraocular movements. ENT   Head: Normocephalic and atraumatic.   Nose: No congestion/rhinnorhea.   Mouth/Throat: Mucous membranes are moist.   Neck: No stridor. Hematological/Lymphatic/Immunilogical: No cervical lymphadenopathy. Cardiovascular: Normal rate, regular rhythm. Normal and symmetric distal pulses are present in all extremities. No murmurs, rubs, or gallops. Respiratory: Normal respiratory effort without tachypnea nor retractions. Breath sounds are clear and equal bilaterally. No wheezes/rales/rhonchi. Gastrointestinal: Soft and nontender. No distention. There is no CVA tenderness. Genitourinary: deferred Musculoskeletal: Nontender with normal range of motion in all extremities. No joint effusions.  No lower extremity tenderness nor edema. Neurologic:  Normal speech and language. No gross focal neurologic deficits are appreciated. Speech is normal.  Skin:  Skin is warm, dry and intact. No rash noted. Psychiatric: Mood and affect are normal. Speech and behavior are normal. Patient exhibits appropriate insight and judgment.  ____________________________________________    LABS (pertinent positives/negatives)  Labs Reviewed  GLUCOSE, CAPILLARY - Abnormal; Notable for the following:  Glucose-Capillary 271 (*)    All other components within normal limits  BASIC METABOLIC PANEL - Abnormal; Notable for the following:    Glucose, Bld 293 (*)    All other components within normal limits  URINALYSIS COMPLETEWITH  MICROSCOPIC ([facility] ONLY) - Abnormal; Notable for the following:    Color, Urine YELLOW (*)    APPearance CLEAR (*)    Glucose, UA >500 (*)    Ketones, ur 1+ (*)    Specific Gravity, Urine 1.031 (*)    Leukocytes, UA 1+ (*)    Squamous Epithelial / LPF 0-5 (*)    All other components within normal limits  CBC  TROPONIN I  CBG MONITORING, ED     ____________________________________________   EKG  ED ECG REPORT I, BROWN, [place] N, the attending physician, personally viewed and interpreted this ECG.   Date: 09/06/2015  EKG Time: 9:31 PM  Rate: 67  Rhythm: Normal sinus rhythm  Axis: None  Intervals: Normal  ST&T Change:    ____________________________________________    RADIOLOGY  CT Head Wo Contrast (Final result) Result time: 09/06/15 01:10:00   Final result by Rad Results In Interface (09/06/15 01:10:00)   Narrative:   CLINICAL DATA: Syncopal episode. Patient fell and does not remember falling. Weakness. Left knee pain.  EXAM: CT HEAD WITHOUT CONTRAST  TECHNIQUE: Contiguous axial images were obtained from the base of the skull through the vertex without intravenous contrast.  COMPARISON: None.  FINDINGS: Ventricles and sulci appear symmetrical. Mild cerebral atrophy. No significant ventricular dilatation. No mass effect or midline shift. No abnormal extra-axial fluid collections. Gray-white matter junctions are distinct. Basal cisterns are not effaced. No evidence of acute intracranial hemorrhage. No depressed skull fractures. Visualized paranasal sinuses and mastoid air cells are not opacified. Vascular calcifications.  IMPRESSION: No acute intracranial abnormalities. Mild chronic atrophy.   Electronically Signed By: Burman Nieves M.D. On: 09/06/2015 01:10          DG Knee Complete 4 Views Left (Final result) Result time: 09/05/15 22:12:50   Final result by Rad Results In Interface (09/05/15 22:12:50)   Narrative:   CLINICAL  DATA: Status post syncope and fall. Generalized weakness and left knee pain. Initial encounter.  EXAM: LEFT KNEE - COMPLETE 4+ VIEW  COMPARISON: None.  FINDINGS: There is no evidence of acute fracture or dislocation. There appears to be chronic deformity involving the lateral tibial plateau, reflecting remote injury. Marginal osteophytes are seen arising at the medial and lateral compartments.  No significant joint effusion is seen. The visualized soft tissues are normal in appearance.  IMPRESSION: 1. No evidence of acute fracture or dislocation. 2. Chronic deformity involving the lateral tibial plateau, reflecting remote injury. Underlying osteoarthritis noted.   Electronically Signed By: Roanna Raider M.D. On: 09/05/2015 22:12          INITIAL IMPRESSION / ASSESSMENT AND PLAN / ED COURSE  Pertinent labs & imaging results that were available during my care of the patient were reviewed by me and considered in my medical decision making (see chart for details).  Patient discussed with Dr. Sheryle Hail for hospital admission for further evaluation and management  ____________________________________________   FINAL CLINICAL IMPRESSION(S) / ED DIAGNOSES  Final diagnoses:  Syncope, unspecified syncope type      Darci Current, MD 09/06/15 [id number]

## 2015-09-06 NOTE — Progress Notes (Signed)
*  PRELIMINARY RESULTS* Echocardiogram 2D Echocardiogram has been performed.  Georgann Housekeeper Hege 09/06/2015, 2:42 PM

## 2015-09-06 NOTE — H&P (Signed)
[facility] [place] at [facility]   PATIENT NAME: Mary Hodges    MR#:  [id number]  DATE OF BIRTH:  [birth year]  DATE OF ADMISSION:  09/05/2015  PRIMARY CARE PHYSICIAN: Virl Cagey, MD   REQUESTING/REFERRING PHYSICIAN: Manson Passey, MD  CHIEF COMPLAINT:   Chief Complaint  Patient presents with  . Weakness    HISTORY OF PRESENT ILLNESS:  Mary Hodges  is a 75 y.o. female who presents with syncope. Patient states she was at home tonight moving some things around her house and walking around. She then had an episode of syncope. She does not recall passing out. She does recall waking up in her kitchen. This episode was unwitnessed. Patient denies any memory of seizure-like activity. She did not have a significant post ictal state, she recognized where she was when she woke up. She did not lose control of bowel or bladder. She denies any history of heart disease, except for remote history of being told she had some arrhythmia. She does not recall what arrhythmia she may have had. No prior history of stroke, and she reports no focal deficits at this time. Initial evaluation in the ED was largely benign. Hospitalists were called for admission for workup for syncope  PAST MEDICAL HISTORY:   Past Medical History  Diagnosis Date  . Type 2 diabetes mellitus (HCC)   . HTN (hypertension)   . HLD (hyperlipidemia)   . GERD (gastroesophageal reflux disease)   . Asthma     PAST SURGICAL HISTORY:   Past Surgical History  Procedure Laterality Date  . Left toe surgery      SOCIAL HISTORY:   Social History  Substance Use Topics  . Smoking status: Never Smoker   . Smokeless tobacco: Not on file  . Alcohol Use: No    FAMILY HISTORY:   Family History  Problem Relation Age of Onset  . CAD Father   . CVA Mother     DRUG ALLERGIES:  No Known Allergies  MEDICATIONS AT HOME:   Prior to Admission medications   Medication Sig Start Date End Date Taking? Authorizing  Provider  albuterol (PROAIR HFA) 108 (90 Base) MCG/ACT inhaler Inhale 2 puffs into the lungs every 6 (six) hours as needed. 01/30/15 01/30/16 Yes Historical Provider, MD  amLODipine (NORVASC) 10 MG tablet Take 10 mg by mouth daily. 07/28/15  Yes Historical Provider, MD  aspirin EC 81 MG tablet Take 1 tablet by mouth daily.   Yes Historical Provider, MD  esomeprazole (NEXIUM) 40 MG capsule Take 1 capsule by mouth daily.   Yes Historical Provider, MD  glipiZIDE (GLUCOTROL XL) 10 MG 24 hr tablet Take 1 tablet by mouth daily. 07/28/15  Yes Historical Provider, MD  LEVEMIR FLEXTOUCH 100 UNIT/ML Pen Inject 60 Units into the skin at bedtime. 06/29/15  Yes Historical Provider, MD  lovastatin (MEVACOR) 20 MG tablet Take 1 tablet by mouth daily. 07/28/15  Yes Historical Provider, MD  metFORMIN (GLUCOPHAGE-XR) 500 MG 24 hr tablet Take 1 tablet by mouth daily. 07/28/15  Yes Historical Provider, MD  sitaGLIPtin (JANUVIA) 100 MG tablet Take 1 tablet by mouth daily. 01/30/15  Yes Historical Provider, MD  valsartan-hydrochlorothiazide (DIOVAN-HCT) 160-25 MG tablet Take 1 tablet by mouth daily. 07/28/15  Yes Historical Provider, MD    REVIEW OF SYSTEMS:  Review of Systems  Constitutional: Negative for fever, chills, weight loss and malaise/fatigue.  HENT: Negative for ear pain, hearing loss and tinnitus.   Eyes: Negative for blurred vision, double  vision, pain and redness.  Respiratory: Negative for cough, hemoptysis and shortness of breath.   Cardiovascular: Negative for chest pain, palpitations, orthopnea and leg swelling.  Gastrointestinal: Negative for nausea, vomiting, abdominal pain, diarrhea and constipation.  Genitourinary: Negative for dysuria, frequency and hematuria.  Musculoskeletal: Negative for back pain, joint pain and neck pain.  Skin:       No acne, rash, or lesions  Neurological: Positive for loss of consciousness. Negative for dizziness, tremors, focal weakness and weakness.   Endo/Heme/Allergies: Negative for polydipsia. Does not bruise/bleed easily.  Psychiatric/Behavioral: Negative for depression. The patient is not nervous/anxious and does not have insomnia.      VITAL SIGNS:   Filed Vitals:   09/05/15 2118 09/05/15 2303 09/06/15 0046 09/06/15 0130  BP: 179/54 173/59 167/61 156/63  Pulse: 69 69 67 65  Temp: 98.2 F (36.8 C)     TempSrc: Oral     Resp: Height:  (1.626 m)     Weight: 111.131 kg (245 lb)     SpO2: 97% 99% 99% 98%   Wt Readings from Last 3 Encounters:  09/05/15 111.131 kg (245 lb)    PHYSICAL EXAMINATION:  Physical Exam  Vitals reviewed. Constitutional: She is oriented to person, place, and time. She appears well-developed and well-nourished. No distress.  HENT:  Head: Normocephalic and atraumatic.  Mouth/Throat: Oropharynx is clear and moist.  Eyes: Conjunctivae and EOM are normal. Pupils are equal, round, and reactive to light. No scleral icterus.  Neck: Normal range of motion. Neck supple. No JVD present. No thyromegaly present.  Cardiovascular: Normal rate, regular rhythm and intact distal pulses.  Exam reveals no gallop and no friction rub.   No murmur heard. Respiratory: Effort normal and breath sounds normal. No respiratory distress. She has no wheezes. She has no rales.  GI: Soft. Bowel sounds are normal. She exhibits no distension. There is no tenderness.  Musculoskeletal: Normal range of motion. She exhibits no edema.  No arthritis, no gout  Lymphadenopathy:    She has no cervical adenopathy.  Neurological: She is alert and oriented to person, place, and time. No cranial nerve deficit.  No dysarthria, no aphasia  Skin: Skin is warm and dry. No rash noted. No erythema.  Psychiatric: She has a normal mood and affect. Her behavior is normal. Judgment and thought content normal.    LABORATORY PANEL:   CBC  Recent Labs Lab 09/05/15 2125  WBC 10.4  HGB 13.5  HCT 40.6  PLT 226    ------------------------------------------------------------------------------------------------------------------  Chemistries   Recent Labs Lab 09/05/15 2125  NA 139  K 3.5  CL 104  CO2 26  GLUCOSE 293*  BUN 11  CREATININE 0.71  CALCIUM 9.2   ------------------------------------------------------------------------------------------------------------------  Cardiac Enzymes  Recent Labs Lab 09/05/15 2125  TROPONINI <0.03   ------------------------------------------------------------------------------------------------------------------  RADIOLOGY:  Ct Head Wo Contrast  09/06/2015  CLINICAL DATA:  Syncopal episode. Patient fell and does not remember falling. Weakness. Left knee pain. EXAM: CT HEAD WITHOUT CONTRAST TECHNIQUE: Contiguous axial images were obtained from the base of the skull through the vertex without intravenous contrast. COMPARISON:  None. FINDINGS: Ventricles and sulci appear symmetrical. Mild cerebral atrophy. No significant ventricular dilatation. No mass effect or midline shift. No abnormal extra-axial fluid collections. Gray-white matter junctions are distinct. Basal cisterns are not effaced. No evidence of acute intracranial hemorrhage. No depressed skull fractures. Visualized paranasal sinuses and mastoid air cells are not opacified. Vascular calcifications. IMPRESSION: No acute intracranial  abnormalities.  Mild chronic atrophy. Electronically Signed   By: Burman Nieves M.D.   On: 09/06/2015 01:10   Dg Knee Complete 4 Views Left  09/05/2015  CLINICAL DATA:  Status post syncope and fall. Generalized weakness and left knee pain. Initial encounter. EXAM: LEFT KNEE - COMPLETE 4+ VIEW COMPARISON:  None. FINDINGS: There is no evidence of acute fracture or dislocation. There appears to be chronic deformity involving the lateral tibial plateau, reflecting remote injury. Marginal osteophytes are seen arising at the medial and lateral compartments. No significant  joint effusion is seen. The visualized soft tissues are normal in appearance. IMPRESSION: 1. No evidence of acute fracture or dislocation. 2. Chronic deformity involving the lateral tibial plateau, reflecting remote injury. Underlying osteoarthritis noted. Electronically Signed   By: Roanna Raider M.D.   On: 09/05/2015 22:12    EKG:   Orders placed or performed during the hospital encounter of 09/05/15  . EKG 12-Lead  . EKG 12-Lead  . ED EKG  . ED EKG    IMPRESSION AND PLAN:  Principal Problem:   Syncope - unclear etiology. Perhaps related to this remote history of arrhythmia. Patient is in sinus rhythm currently on monitor, without significant findings on EKG. We will wanted her on telemetry, trend her cardiac enzymes, order an echocardiogram, and get a cardiology consult. Active Problems:   HTN (hypertension) - continue home meds   Type 2 diabetes mellitus (HCC) - sliding scale insulin with corresponding glucose checks and carb modified diet. Continue home long acting insulin as well   HLD (hyperlipidemia) - continue home meds   GERD (gastroesophageal reflux disease) - home dose PPI  All the records are reviewed and case discussed with ED provider. Management plans discussed with the patient and/or family.  DVT PROPHYLAXIS: SubQ lovenox  GI PROPHYLAXIS: PPI  ADMISSION STATUS: Observation  CODE STATUS: Full Code Status History    This patient does not have a recorded code status. Please follow your organizational policy for patients in this situation.      TOTAL TIME TAKING CARE OF THIS PATIENT: 45 minutes.    Monai Hindes FIELDING 09/06/2015, 3:05 AM  [facility]  Office  [phone]  CC: Primary care physician; Virl Cagey, MD

## 2015-09-06 NOTE — Consult Note (Signed)
[facility] Cardiology Consultation Note  Patient ID: MEOSHIA BILLING, MRN: [id number], DOB/AGE: 1940/08/17 75 y.o. Admit date: 09/05/2015   Date of Consult: 09/06/2015 Primary Physician: Virl Cagey, MD Primary Cardiologist: None  Chief Complaint:  Chief Complaint  Patient presents with  . Weakness   Reason for Consult: syncope  HPI: 75 y.o. female with known diabetes without complication essential hypertension and mixed hyperlipidemia on appropriate medication management doing fairly well on this management and over the last several months for which she had an episode today for which she passed out and hurt her knee and hand. She had no warning to this episode and was able to regain consciousness fairly quickly. The patient did not have any chest pain shortness of breath or other significant symptoms prior or after this episode and currently is stable at this time hemodynamically without any rhythm disturbances. EKG has shown normal sinus rhythm with poor R-wave progression and troponin levels are normal. The patient has not had any vascular issues in the past  Past Medical History  Diagnosis Date  . Type 2 diabetes mellitus (HCC)   . HTN (hypertension)   . HLD (hyperlipidemia)   . GERD (gastroesophageal reflux disease)   . Asthma   . Arthritis       Surgical History:  Past Surgical History  Procedure Laterality Date  . Left toe surgery       Home Meds: Prior to Admission medications   Medication Sig Start Date End Date Taking? Authorizing Provider  albuterol (PROAIR HFA) 108 (90 Base) MCG/ACT inhaler Inhale 2 puffs into the lungs every 6 (six) hours as needed. 01/30/15 01/30/16 Yes Historical Provider, MD  amLODipine (NORVASC) 10 MG tablet Take 10 mg by mouth daily. 07/28/15  Yes Historical Provider, MD  aspirin EC 81 MG tablet Take 1 tablet by mouth daily.   Yes Historical Provider, MD  esomeprazole (NEXIUM) 40 MG capsule Take 1 capsule by mouth daily.   Yes Historical  Provider, MD  glipiZIDE (GLUCOTROL XL) 10 MG 24 hr tablet Take 1 tablet by mouth daily. 07/28/15  Yes Historical Provider, MD  LEVEMIR FLEXTOUCH 100 UNIT/ML Pen Inject 60 Units into the skin at bedtime. 06/29/15  Yes Historical Provider, MD  lovastatin (MEVACOR) 20 MG tablet Take 1 tablet by mouth daily. 07/28/15  Yes Historical Provider, MD  metFORMIN (GLUCOPHAGE-XR) 500 MG 24 hr tablet Take 1 tablet by mouth daily. 07/28/15  Yes Historical Provider, MD  sitaGLIPtin (JANUVIA) 100 MG tablet Take 1 tablet by mouth daily. 01/30/15  Yes Historical Provider, MD  valsartan-hydrochlorothiazide (DIOVAN-HCT) 160-25 MG tablet Take 1 tablet by mouth daily. 07/28/15  Yes Historical Provider, MD    Inpatient Medications:  . amLODipine  10 mg Oral Daily  . aspirin EC  81 mg Oral Daily  . enoxaparin (LOVENOX) injection  40 mg Subcutaneous BID  . irbesartan  150 mg Oral Daily   And  . hydrochlorothiazide  25 mg Oral Daily  . insulin aspart  0-5 Units Subcutaneous QHS  . insulin aspart  0-9 Units Subcutaneous TID WC  . insulin detemir  20 Units Subcutaneous QHS  . pantoprazole  40 mg Oral Daily  . pravastatin  20 mg Oral q1800  . sodium chloride flush  3 mL Intravenous Q12H      Allergies: No Known Allergies  Social History   Social History  . Marital Status: Widowed    Spouse Name: N/A  . Number of Children: N/A  . Years of Education:  N/A   Occupational History  . Not on file.   Social History Main Topics  . Smoking status: Never Smoker   . Smokeless tobacco: Not on file  . Alcohol Use: No  . Drug Use: No  . Sexual Activity: Not on file   Other Topics Concern  . Not on file   Social History Narrative     Family History  Problem Relation Age of Onset  . CAD Father   . CVA Mother      Review of Systems Positive for syncope Negative for: General:  chills, fever, night sweats or weight changes.  Cardiovascular: PND orthopnea syncope dizziness  Dermatological skin lesions  rashes Respiratory: Cough congestion Urologic: Frequent urination urination at night and hematuria Abdominal: negative for nausea, vomiting, diarrhea, bright red blood per rectum, melena, or hematemesis Neurologic: negative for visual changes, and/or hearing changes  All other systems reviewed and are otherwise negative except as noted above.  Labs:  Recent Labs  09/05/15 2125 09/06/15 1015 09/06/15 1505  TROPONINI <0.03 <0.03 <0.03   Lab Results  Component Value Date   WBC 7.6 09/06/2015   HGB 13.0 09/06/2015   HCT 39.0 09/06/2015   MCV 79.7* 09/06/2015   PLT 206 09/06/2015    Recent Labs Lab 09/06/15 1015  NA 139  K 3.7  CL 107  CO2 23  BUN 8  CREATININE 0.50  CALCIUM 8.7*  GLUCOSE 322*   Lab Results  Component Value Date   CHOL 182 08/27/2011   HDL 37* 08/27/2011   LDLCALC 124* 08/27/2011   TRIG 105 08/27/2011   No results found for: DDIMER  Radiology/Studies:  Ct Head Wo Contrast  09/06/2015  CLINICAL DATA:  Syncopal episode. Patient fell and does not remember falling. Weakness. Left knee pain. EXAM: CT HEAD WITHOUT CONTRAST TECHNIQUE: Contiguous axial images were obtained from the base of the skull through the vertex without intravenous contrast. COMPARISON:  None. FINDINGS: Ventricles and sulci appear symmetrical. Mild cerebral atrophy. No significant ventricular dilatation. No mass effect or midline shift. No abnormal extra-axial fluid collections. Gray-white matter junctions are distinct. Basal cisterns are not effaced. No evidence of acute intracranial hemorrhage. No depressed skull fractures. Visualized paranasal sinuses and mastoid air cells are not opacified. Vascular calcifications. IMPRESSION: No acute intracranial abnormalities.  Mild chronic atrophy. Electronically Signed   By: Burman Nieves M.D.   On: 09/06/2015 01:10   Dg Knee Complete 4 Views Left  09/05/2015  CLINICAL DATA:  Status post syncope and fall. Generalized weakness and left knee pain.  Initial encounter. EXAM: LEFT KNEE - COMPLETE 4+ VIEW COMPARISON:  None. FINDINGS: There is no evidence of acute fracture or dislocation. There appears to be chronic deformity involving the lateral tibial plateau, reflecting remote injury. Marginal osteophytes are seen arising at the medial and lateral compartments. No significant joint effusion is seen. The visualized soft tissues are normal in appearance. IMPRESSION: 1. No evidence of acute fracture or dislocation. 2. Chronic deformity involving the lateral tibial plateau, reflecting remote injury. Underlying osteoarthritis noted. Electronically Signed   By: Roanna Raider M.D.   On: 09/05/2015 22:12    EKG: Normal sinus rhythm with poor R-wave progression  Weights: Filed Weights   09/05/15 2118  Weight: 245 lb (111.131 kg)     Physical Exam: Blood pressure 122/96, pulse 61, temperature 98 F (36.7 C), temperature source Oral, resp. rate 18, height  (1.626 m), weight 245 lb (111.131 kg), SpO2 99 %. Body mass index is 42.03  kg/(m^2). General: Well developed, well nourished, in no acute distress. Head eyes ears nose throat: Normocephalic, atraumatic, sclera non-icteric, no xanthomas, nares are without discharge. No apparent thyromegaly and/or mass  Lungs: Normal respiratory effort.  no wheezes, no rales, no rhonchi.  Heart: RRR with normal S1 S2. no murmur gallop, no rub, PMI is normal size and placement, carotid upstroke normal without bruit, jugular venous pressure is normal Abdomen: Soft, non-tender,  with normoactive bowel sounds. No hepatomegaly. No rebound/guarding. No obvious abdominal masses. Abdominal aorta is normal size without bruit Extremities: No edema. no cyanosis, no clubbing, no ulcers  Peripheral : 2+ bilateral upper extremity pulses, 2+ bilateral femoral pulses, 2+ bilateral dorsal pedal pulse Neuro: Alert and oriented. No facial asymmetry. No focal deficit. Moves all extremities spontaneously. Musculoskeletal: Normal  muscle tone without kyphosis Psych:  Responds to questions appropriately with a normal affect.    Assessment: 75 year old female with diabetes without complication mixed hyperlipidemia essential hypertension with an episode of syncope and no current evidence of vascular issues rhythm disturbances or myocardial infarction  Plan: 1. Continue serial ECG and enzymes to assess for possible myocardial infarction 2. Begin ambulation following for rhythm disturbances 3. No change in current medical regimen for hypertension control which appears to be working fairly well 4. Further consideration of outpatient treatment if ambulating well and okay to go home including the possibility of treadmill EKG and/or echocardiogram 5. Patient okay for discharge to home at this time from cardiac standpoint with follow-up next week for treatment as above  Signed, Lamar Blinks M.D. [facility] Cardiology 09/06/2015, 6:39 PM

## 2015-09-06 NOTE — Progress Notes (Signed)
[facility] Physicians - [place] at [facility]   PATIENT NAME: Mary Hodges    MR#:  [id number]  DATE OF BIRTH:  1941-03-14  SUBJECTIVE:  CHIEF COMPLAINT:   Chief Complaint  Patient presents with  . Weakness     Came with frequent falls in last 6 months, and episode of passing out last night.   No chest pain.  REVIEW OF SYSTEMS:  CONSTITUTIONAL: No fever, fatigue or weakness.  EYES: No blurred or double vision.  EARS, NOSE, AND THROAT: No tinnitus or ear pain.  RESPIRATORY: No cough, shortness of breath, wheezing or hemoptysis.  CARDIOVASCULAR: No chest pain, orthopnea, edema.  GASTROINTESTINAL: No nausea, vomiting, diarrhea or abdominal pain.  GENITOURINARY: No dysuria, hematuria.  ENDOCRINE: No polyuria, nocturia,  HEMATOLOGY: No anemia, easy bruising or bleeding SKIN: No rash or lesion. MUSCULOSKELETAL: No joint pain or arthritis.   NEUROLOGIC: No tingling, numbness, weakness.  PSYCHIATRY: No anxiety or depression.   ROS  DRUG ALLERGIES:  No Known Allergies  VITALS:  Blood pressure 128/51, pulse 60, temperature 97.3 F (36.3 C), temperature source Oral, resp. rate 18, height  (1.626 m), weight 111.131 kg (245 lb), SpO2 100 %.  PHYSICAL EXAMINATION:   Constitutional: She is oriented to person, place, and time. She appears well-developed and well-nourished. No distress.  HENT:  Head: Normocephalic and atraumatic.  Mouth/Throat: Oropharynx is clear and moist.  Eyes: Conjunctivae and EOM are normal. Pupils are equal, round, and reactive to light. No scleral icterus.  Neck: Normal range of motion. Neck supple. No JVD present. No thyromegaly present.  Cardiovascular: Normal rate, regular rhythm and intact distal pulses. Exam reveals no gallop and no friction rub.  No murmur heard. Respiratory: Effort normal and breath sounds normal. No respiratory distress. She has no wheezes. She has no rales.  GI: Soft. Bowel sounds are normal. She exhibits no  distension. There is no tenderness.  Musculoskeletal: Normal range of motion. She exhibits no edema.  No arthritis, no gout  Lymphadenopathy:   She has no cervical adenopathy.  Neurological: She is alert and oriented to person, place, and time. No cranial nerve deficit.  No dysarthria, no aphasia  Skin: Skin is warm and dry. No rash noted. No erythema.  Psychiatric: She has a normal mood and affect. Her behavior is normal. Judgment and thought content normal.     Physical Exam LABORATORY PANEL:   CBC  Recent Labs Lab 09/06/15 1015  WBC 7.6  HGB 13.0  HCT 39.0  PLT 206   ------------------------------------------------------------------------------------------------------------------  Chemistries   Recent Labs Lab 09/06/15 1015  NA 139  K 3.7  CL 107  CO2 23  GLUCOSE 322*  BUN 8  CREATININE 0.50  CALCIUM 8.7*   ------------------------------------------------------------------------------------------------------------------  Cardiac Enzymes  Recent Labs Lab 09/06/15 1505 09/06/15 2016  TROPONINI <0.03 <0.03   ------------------------------------------------------------------------------------------------------------------  RADIOLOGY:  Ct Head Wo Contrast  09/06/2015  CLINICAL DATA:  Syncopal episode. Patient fell and does not remember falling. Weakness. Left knee pain. EXAM: CT HEAD WITHOUT CONTRAST TECHNIQUE: Contiguous axial images were obtained from the base of the skull through the vertex without intravenous contrast. COMPARISON:  None. FINDINGS: Ventricles and sulci appear symmetrical. Mild cerebral atrophy. No significant ventricular dilatation. No mass effect or midline shift. No abnormal extra-axial fluid collections. Gray-white matter junctions are distinct. Basal cisterns are not effaced. No evidence of acute intracranial hemorrhage. No depressed skull fractures. Visualized paranasal sinuses and mastoid air cells are not opacified. Vascular  calcifications. IMPRESSION: No  acute intracranial abnormalities.  Mild chronic atrophy. Electronically Signed   By: Burman Nieves M.D.   On: 09/06/2015 01:10   Dg Knee Complete 4 Views Left  09/05/2015  CLINICAL DATA:  Status post syncope and fall. Generalized weakness and left knee pain. Initial encounter. EXAM: LEFT KNEE - COMPLETE 4+ VIEW COMPARISON:  None. FINDINGS: There is no evidence of acute fracture or dislocation. There appears to be chronic deformity involving the lateral tibial plateau, reflecting remote injury. Marginal osteophytes are seen arising at the medial and lateral compartments. No significant joint effusion is seen. The visualized soft tissues are normal in appearance. IMPRESSION: 1. No evidence of acute fracture or dislocation. 2. Chronic deformity involving the lateral tibial plateau, reflecting remote injury. Underlying osteoarthritis noted. Electronically Signed   By: Roanna Raider M.D.   On: 09/05/2015 22:12    ASSESSMENT AND PLAN:   Principal Problem:   Syncope Active Problems:   HTN (hypertension)   HLD (hyperlipidemia)   Type 2 diabetes mellitus (HCC)   GERD (gastroesophageal reflux disease)  * Syncope - unclear etiology. Perhaps related to arrhythmia.  Patient is in sinus rhythm currently on monitor, without significant findings on EKG. negative cardiac enzymes, normal echocardiogram,awaited cardiology consult.   May need PT  * HTN (hypertension) - continue home meds * Type 2 diabetes mellitus (HCC) - sliding scale insulin with corresponding glucose         checks and carb modified diet. Continue home long acting insulin as well * HLD (hyperlipidemia) - continue home meds * GERD (gastroesophageal reflux disease) - home dose PPI     All the records are reviewed and case discussed with Care Management/Social Workerr. Management plans discussed with the patient, family and they are in agreement.  CODE STATUS: Full  TOTAL TIME TAKING CARE OF THIS  PATIENT: 35 minutes.   POSSIBLE D/C IN 1-2 DAYS, DEPENDING ON CLINICAL CONDITION.   Altamese Dilling M.D on 09/06/2015   Between 7am to 6pm - Pager - [phone]  After 6pm go to [email or web address] - password [email or web address] [facility]  [place] [facility]  Office  [phone]  CC: Primary care physician; Virl Cagey, MD  Note: This dictation was prepared with Dragon dictation along with smaller phrase technology. Any transcriptional errors that result from this process are unintentional.

## 2015-09-06 NOTE — Care Management Obs Status (Signed)
MEDICARE OBSERVATION STATUS NOTIFICATION   Patient Details  Name: Mary Hodges MRN: [id number] Date of Birth: 04/22/1941   Medicare Observation Status Notification Given:  Yes  Refused to sign    Eber Hong, RN 09/06/2015, 5:00 PM

## 2015-09-06 NOTE — Progress Notes (Signed)
Inpatient Diabetes Program Recommendations  AACE/ADA: New Consensus Statement on Inpatient Glycemic Control (2015)  Target Ranges:  Prepandial:   less than 140 mg/dL      Peak postprandial:   less than 180 mg/dL (1-2 hours)      Critically ill patients:  140 - 180 mg/dL   Review of Glycemic Control  Results for VONA, WHITERS (MRN [id number]) as of 09/06/2015 13:20  Ref. Range 09/05/2015 21:21 09/06/2015 09:08 09/06/2015 12:33  Glucose-Capillary Latest Ref Range: 65-99 mg/dL 409 (H) 811 (H) 914 (H)    Diabetes history: Type 2 Outpatient Diabetes medications: Januvia /day, Metformin /day, Levemir 60 units qhs, Glucotrol  qday Current orders for Inpatient glycemic control: Levemir 20 units qhs, Novolog 0-9 units tid, Novolog 0-5 units qhs  Inpatient Diabetes Program Recommendations:  Consider starting Levemir now and increasing Novolog correction insulin to resistant correction scale 0-20 units tid  Susette Racer, RN, [place], [place], CDE Diabetes Coordinator Inpatient Diabetes Program  [phone] (Team Pager) [phone] [facility] Office) 09/06/2015 1:23 PM

## 2015-09-06 NOTE — ED Notes (Signed)
Notified pharmacy for lovenox

## 2015-09-07 LAB — GLUCOSE, CAPILLARY
Glucose-Capillary: 218 mg/dL — ABNORMAL HIGH (ref 65–99)
Glucose-Capillary: 333 mg/dL — ABNORMAL HIGH (ref 65–99)

## 2015-09-07 MED ORDER — HYDROCODONE-ACETAMINOPHEN 5-325 MG PO TABS: 1.0000 | ORAL_TABLET | Freq: Three times a day (TID) | ORAL | Status: DC | PRN

## 2015-09-07 NOTE — Evaluation (Signed)
Physical Therapy Evaluation Patient Details Name: Mary Hodges MRN: [id number] DOB: Jan 08, 1941 Today's Date: 09/07/2015   History of Present Illness  Mary Hodges is a 75 y.o. female who presents with syncope. Patient states she was at home tonight moving some things around her house and walking around. She then had an episode of syncope. She does not recall passing out. She does recall waking up in her kitchen. This episode was unwitnessed. Patient denies any memory of seizure-like activity. She did not have a significant post ictal state, she recognized where she was when she woke up. She did not lose control of bowel or bladder. She denies any history of heart disease, except for remote history of being told she had some arrhythmia. She does not recall what arrhythmia she may have had. No prior history of stroke, and she reports no focal deficits at this time. Initial evaluation in the ED was largely benign. Hospitalists were called for admission for workup for syncope. Pt endorses a total of 2 falls in the last 12 months. Reports full community ambulation without assistive device and independence with ADLs/IADLs.   Clinical Impression  Pt demonstrates baseline mobility with the exception of L knee pain. Encouraged pt to utilize single point cane for ambulation to off load L knee and follow-up with orthopedics if L knee pain does not steadily improve. Otherwise she is safe and steady with all mobility. No PT needs identified at this time. PT order will be completed. Please enter new order if status change.    Follow Up Recommendations No PT follow up;Other (comment) (Follow-up outpatient ortho if L knee pain does not improve)    Equipment Recommendations  None recommended by PT;Other (comment) (Encouraged to use single point cane for L knee pain)    Recommendations for Other Services       Precautions / Restrictions Precautions Precautions: Fall Restrictions Weight Bearing Restrictions: No       Mobility  Bed Mobility Overal bed mobility: Independent             General bed mobility comments: Good speed/sequencing  Transfers Overall transfer level: Independent Equipment used: None             General transfer comment: Good speed and LE strength. No notable limiting weight shift to LLE  Ambulation/Gait Ambulation/Gait assistance: Min guard Ambulation Distance (Feet): 80 Feet Assistive device: None Gait Pattern/deviations: Decreased step length - right;Decreased stance time - left;Antalgic Gait velocity: Decreased Gait velocity interpretation: Below normal speed for age/gender General Gait Details: Pt with mildly antalgic gait on L with slight decrease in R step length. No assistive device and reasonable stability noted. Once in hall pt reaches for wall rail during to L knee pain. Encouraged to utilize single point cane temporarily to help with L knee pain at discharge. With the exception of L knee pain pt reports that her mobility is baseilne  Information systems manager [place] (Stroke Patients Only)       Balance Overall balance assessment: No apparent balance deficits (not formally assessed) (Higher level balance deficits, cannot test/L knee pain)                                           Pertinent Vitals/Pain Pain Assessment: 0-10 Pain Score: 9  Pain Location: L knee  Pain Intervention(s): Monitored during session    Home Living Family/patient expects to be discharged to:: Private residence Living Arrangements: Children Available Help at Discharge: Family Type of Home: House Home Access: Stairs to enter Entrance Stairs-Rails: Can reach both Entrance Stairs-Number of Steps: 2 Home Layout: One level Home Equipment: Walker - 2 wheels;Cane - single point      Prior Function Level of Independence: Independent               Hand Dominance   Dominant Hand: Right    Extremity/Trunk  Assessment   Upper Extremity Assessment: Overall WFL for tasks assessed           Lower Extremity Assessment: LLE deficits/detail   LLE Deficits / Details: RLE grossly WFL     Communication   Communication: No difficulties  Cognition Arousal/Alertness: Awake/alert Behavior During Therapy: WFL for tasks assessed/performed Overall Cognitive Status: Within Functional Limits for tasks assessed                      General Comments      Exercises        Assessment/Plan    PT Assessment Patent does not need any further PT services  PT Diagnosis Acute pain   PT Problem List    PT Treatment Interventions     PT Goals (Current goals can be found in the Care Plan section) Acute Rehab PT Goals Patient Stated Goal: Return home PT Goal Formulation: With patient    Frequency     Barriers to discharge        Co-evaluation               End of Session Equipment Utilized During Treatment: Gait belt Activity Tolerance: Patient tolerated treatment well Patient left: in bed;with bed alarm set;with call bell/phone within reach      Functional Assessment Tool Used: clinical judgement, gait, sit to stand Functional Limitation: Mobility: Walking and moving around Mobility: Walking and Moving Around Current Status (Z6109): At least 1 percent but less than 20 percent impaired, limited or restricted Mobility: Walking and Moving Around Goal Status [phone]): At least 1 percent but less than 20 percent impaired, limited or restricted Mobility: Walking and Moving Around Discharge Status [phone]): At least 1 percent but less than 20 percent impaired, limited or restricted    Time: 0915-0936 PT Time Calculation (min) (ACUTE ONLY): 21 min   Charges:   PT Evaluation $PT Eval Low Complexity: 1 Procedure     PT G Codes:   PT G-Codes **NOT FOR INPATIENT CLASS** Functional Assessment Tool Used: clinical judgement, gait, sit to stand Functional Limitation: Mobility: Walking  and moving around Mobility: Walking and Moving Around Current Status (B1478): At least 1 percent but less than 20 percent impaired, limited or restricted Mobility: Walking and Moving Around Goal Status [phone]): At least 1 percent but less than 20 percent impaired, limited or restricted Mobility: Walking and Moving Around Discharge Status [phone]): At least 1 percent but less than 20 percent impaired, limited or restricted   Lynnea Maizes PT, DPT   Huprich,Jason 09/07/2015, 9:55 AM

## 2015-09-07 NOTE — Discharge Instructions (Signed)

## 2015-09-07 NOTE — Progress Notes (Signed)
PT TO BE DISCHARGED TO HOME TODAY. IV AND TELE REMOVED. DISCH INSTRUCTIONS GIVEN TO PT TO HER UNDERSTANDING. 1 PRESCRIPTION GIVEN. disch via w.c. To hoem aaccompanied by family member.

## 2015-09-07 NOTE — Progress Notes (Signed)
Inpatient Diabetes Program Recommendations  AACE/ADA: New Consensus Statement on Inpatient Glycemic Control (2015)  Target Ranges:  Prepandial:   less than 140 mg/dL      Peak postprandial:   less than 180 mg/dL (1-2 hours)      Critically ill patients:  140 - 180 mg/dL   Review of Glycemic Control  Results for BREASIA, KARGES (MRN [id number]) as of 09/07/2015 11:54  Ref. Range 09/06/2015 09:08 09/06/2015 12:33 09/06/2015 16:39 09/07/2015 07:26 09/07/2015 11:31  Glucose-Capillary Latest Ref Range: 65-99 mg/dL 409 (H) 811 (H) 914 (H) 218 (H) 333 (H)    Diabetes history: Type 2 Outpatient Diabetes medications: Januvia /day, Metformin /day, Levemir 60 units qhs, Glucotrol  qday Current orders for Inpatient glycemic control: Levemir 20 units qhs, Novolog 0-9 units tid, Novolog 0-5 units qhs  Reviewed medications and insulin administration with the patient.  She faithfully takes her diabetes medications as ordered.  She administers insulin in her abdomen, rotating sites, uses a new insulin pen needle for each insulin administration and stores un-opened insulin in the fridge.  She was unaware of what an A1C is but tells me her MD increased her Levemir from 50 to 60 units  the last time she saw him (November 2016) because her numbers were too high.  Susette Racer, RN, BA, MHA, CDE Diabetes Coordinator Inpatient Diabetes Program  [phone] (Team Pager) [phone] [facility] Office) 09/07/2015 12:12 PM

## 2015-09-09 NOTE — Discharge Summary (Signed)
[facility] [place] [facility]   PATIENT NAME: Mary Hodges    MR#:  [id number]  DATE OF BIRTH:  07/15/1941  DATE OF ADMISSION:  09/05/2015 ADMITTING PHYSICIAN: Oralia Manis, MD  DATE OF DISCHARGE: 09/07/2015  1:03 PM  PRIMARY CARE PHYSICIAN: LAMB, Reola Mosher, MD    ADMISSION DIAGNOSIS:  Syncope, unspecified syncope type [R55]  DISCHARGE DIAGNOSIS:  Principal Problem:   Syncope Active Problems:   HTN (hypertension)   HLD (hyperlipidemia)   Type 2 diabetes mellitus (HCC)   GERD (gastroesophageal reflux disease)   SECONDARY DIAGNOSIS:   Past Medical History  Diagnosis Date  . Type 2 diabetes mellitus (HCC)   . HTN (hypertension)   . HLD (hyperlipidemia)   . GERD (gastroesophageal reflux disease)   . Asthma   . Arthritis      ADMITTING HISTORY  Mary Hodges is a 75 y.o. female who presents with syncope. Patient states she was at home tonight moving some things around her house and walking around. She then had an episode of syncope. She does not recall passing out. She does recall waking up in her kitchen. This episode was unwitnessed. Patient denies any memory of seizure-like activity. She did not have a significant post ictal state, she recognized where she was when she woke up. She did not lose control of bowel or bladder. She denies any history of heart disease, except for remote history of being told she had some arrhythmia. She does not recall what arrhythmia she may have had. No prior history of stroke, and she reports no focal deficits at this time. Initial evaluation in the ED was largely benign. Hospitalists were called for admission for workup for syncope   HOSPITAL COURSE:   Patient was admitted to telemetry floor. Chronic enzymes are normal. Telemetry showed no arrhythmias. Echocardiogram was done which showed no significant change in EF or valvular abnormalities. Seen by cardiology and suggested follow-up as outpatient for a stress test  and possible Holter monitor. Physical therapy saw the patient and patient did not need any outpatient physical therapy. Discharged home in a stable condition to follow-up with cardiology in 1 week.   CONSULTS OBTAINED:  Treatment Team:  Lamar Blinks, MD  DRUG ALLERGIES:  No Known Allergies  DISCHARGE MEDICATIONS:   Discharge Medication List as of 09/07/2015 12:43 PM    START taking these medications   Details  HYDROcodone-acetaminophen (NORCO/VICODIN) 5-325 MG tablet Take 1 tablet by mouth every 8 (eight) hours as needed for moderate pain., Starting 09/07/2015, Until Discontinued, Print      CONTINUE these medications which have NOT CHANGED   Details  albuterol (PROAIR HFA) 108 (90 Base) MCG/ACT inhaler Inhale 2 puffs into the lungs every 6 (six) hours as needed., Starting 01/30/2015, Until Wed 01/30/16, Historical Med    amLODipine (NORVASC) 10 MG tablet Take 10 mg by mouth daily., Starting 07/28/2015, Until Discontinued, Historical Med    aspirin EC 81 MG tablet Take 1 tablet by mouth daily., Until Discontinued, Historical Med    esomeprazole (NEXIUM) 40 MG capsule Take 1 capsule by mouth daily., Until Discontinued, Historical Med    glipiZIDE (GLUCOTROL XL) 10 MG 24 hr tablet Take 1 tablet by mouth daily., Starting 07/28/2015, Until Discontinued, Historical Med    LEVEMIR FLEXTOUCH 100 UNIT/ML Pen Inject 60 Units into the skin at bedtime., Starting 06/29/2015, Until Discontinued, Historical Med    lovastatin (MEVACOR) 20 MG tablet Take 1 tablet by mouth daily., Starting 07/28/2015, Until Discontinued,  Historical Med    metFORMIN (GLUCOPHAGE-XR) 500 MG 24 hr tablet Take 1 tablet by mouth daily., Starting 07/28/2015, Until Discontinued, Historical Med    sitaGLIPtin (JANUVIA) 100 MG tablet Take 1 tablet by mouth daily., Starting 01/30/2015, Until Discontinued, Historical Med    valsartan-hydrochlorothiazide (DIOVAN-HCT) 160-25 MG tablet Take 1 tablet by mouth daily., Starting  07/28/2015, Until Discontinued, Historical Med         Today    VITAL SIGNS:  Blood pressure 181/75, pulse 55, temperature 97.8 F (36.6 C), temperature source Oral, resp. rate 18, height  (1.626 m), weight 111.131 kg (245 lb), SpO2 99 %.  I/O:  No intake or output data in the 24 hours ending 09/09/15 1534  PHYSICAL EXAMINATION:  Physical Exam  GENERAL:  75 y.o.-year-old patient lying in the bed with no acute distress.  LUNGS: Normal breath sounds bilaterally, no wheezing, rales,rhonchi or crepitation. No use of accessory muscles of respiration.  CARDIOVASCULAR: S1, S2 normal. No murmurs, rubs, or gallops.  ABDOMEN: Soft, non-tender, non-distended. Bowel sounds present. No organomegaly or mass.  NEUROLOGIC: Moves all 4 extremities. PSYCHIATRIC: The patient is alert and oriented x 3.  SKIN: No obvious rash, lesion, or ulcer.   DATA REVIEW:   CBC  Recent Labs Lab 09/06/15 1015  WBC 7.6  HGB 13.0  HCT 39.0  PLT 206    Chemistries   Recent Labs Lab 09/06/15 1015  NA 139  K 3.7  CL 107  CO2 23  GLUCOSE 322*  BUN 8  CREATININE 0.50  CALCIUM 8.7*    Cardiac Enzymes  Recent Labs Lab 09/06/15 2016  TROPONINI <0.03    Microbiology Results  No results found for this or any previous visit.  RADIOLOGY:  No results found.    Follow up with PCP in 1 week.  Management plans discussed with the patient, family and they are in agreement.  CODE STATUS:  Code Status History    Date Active Date Inactive Code Status Order ID Comments User Context   09/06/2015  8:11 AM 09/07/2015  4:03 PM Full Code [id number]  Oralia Manis, MD ED      TOTAL TIME TAKING CARE OF THIS PATIENT ON DAY OF DISCHARGE: more than 30 minutes.    Milagros Loll R M.D on 09/09/2015 at 3:34 PM  Between 7am to 6pm - Pager - [phone]  After 6pm go to [email or web address] - password [email or web address] [facility]  [facility]  Office  [phone]  CC: Primary care physician; Virl Cagey, MD     Note: This dictation was prepared with Dragon dictation along with smaller phrase technology. Any transcriptional errors that result from this process are unintentional.

## 2015-12-25 DIAGNOSIS — E1169 Type 2 diabetes mellitus with other specified complication: Secondary | ICD-10-CM | POA: Diagnosis not present

## 2015-12-25 DIAGNOSIS — E889 Metabolic disorder, unspecified: Secondary | ICD-10-CM | POA: Diagnosis not present

## 2015-12-25 DIAGNOSIS — I10 Essential (primary) hypertension: Secondary | ICD-10-CM | POA: Diagnosis not present

## 2015-12-25 DIAGNOSIS — E11610 Type 2 diabetes mellitus with diabetic neuropathic arthropathy: Secondary | ICD-10-CM | POA: Diagnosis not present

## 2016-01-15 DIAGNOSIS — E113293 Type 2 diabetes mellitus with mild nonproliferative diabetic retinopathy without macular edema, bilateral: Secondary | ICD-10-CM | POA: Diagnosis not present

## 2016-02-25 DIAGNOSIS — E11610 Type 2 diabetes mellitus with diabetic neuropathic arthropathy: Secondary | ICD-10-CM | POA: Diagnosis not present

## 2016-02-25 DIAGNOSIS — E781 Pure hyperglyceridemia: Secondary | ICD-10-CM | POA: Diagnosis not present

## 2016-03-26 ENCOUNTER — Emergency Department
Admission: EM | Admit: 2016-03-26 | Discharge: 2016-03-26 | Disposition: A | Discharge status: Home / Self Care | Payer: Medicare Other | Attending: Student | Admitting: Student

## 2016-03-26 ENCOUNTER — Encounter: Payer: Self-pay | Admitting: Emergency Medicine

## 2016-03-26 DIAGNOSIS — I10 Essential (primary) hypertension: Secondary | ICD-10-CM | POA: Insufficient documentation

## 2016-03-26 DIAGNOSIS — Z7982 Long term (current) use of aspirin: Secondary | ICD-10-CM | POA: Diagnosis not present

## 2016-03-26 DIAGNOSIS — L02416 Cutaneous abscess of left lower limb: Secondary | ICD-10-CM | POA: Diagnosis not present

## 2016-03-26 DIAGNOSIS — J45909 Unspecified asthma, uncomplicated: Secondary | ICD-10-CM | POA: Insufficient documentation

## 2016-03-26 DIAGNOSIS — Z79899 Other long term (current) drug therapy: Secondary | ICD-10-CM | POA: Insufficient documentation

## 2016-03-26 DIAGNOSIS — E119 Type 2 diabetes mellitus without complications: Secondary | ICD-10-CM | POA: Diagnosis not present

## 2016-03-26 DIAGNOSIS — Z7984 Long term (current) use of oral hypoglycemic drugs: Secondary | ICD-10-CM | POA: Diagnosis not present

## 2016-03-26 DIAGNOSIS — L0291 Cutaneous abscess, unspecified: Secondary | ICD-10-CM

## 2016-03-26 HISTORY — DX: Essential (primary) hypertension: I10

## 2016-03-26 MED ORDER — HYDROCODONE-ACETAMINOPHEN 5-325 MG PO TABS: 1.0000 | ORAL_TABLET | ORAL | 0 refills | Status: DC | PRN

## 2016-03-26 MED ORDER — SULFAMETHOXAZOLE-TRIMETHOPRIM 800-160 MG PO TABS: 1.0000 | ORAL_TABLET | Freq: Two times a day (BID) | ORAL | 0 refills | Status: DC

## 2016-03-26 MED ORDER — LIDOCAINE HCL (PF) 1 % IJ SOLN
5.0000 mL | Freq: Once | INTRAMUSCULAR | Status: DC
  Filled 2016-03-26: qty 5

## 2016-03-26 NOTE — ED Triage Notes (Signed)
Developed a possible abscess area to right buttock area about 1 week ago

## 2016-03-26 NOTE — ED Provider Notes (Signed)
[facility] Emergency Department Provider Note  ____________________________________________   First MD Initiated Contact with Patient 03/26/16 [phone]     (approximate)  I have reviewed the triage vital signs and the nursing notes.   HISTORY  Chief Complaint Abscess    HPI Mary Hodges is a 75 y.o. female is here with complaint of abscess to her posterior thigh. Patient states that this is been there for approximately 2-3 days. She is unable to evaluate initially because  she cannot see it. Patient has had one area prior to this that was in the axilla. Patient has taken hydrocodone in the past for pain without any adverse reactions. Patient denies any drainage from the area but states that there is increasing pain. She has not taken any over-the-counter medication for this.   Past Medical History:  Diagnosis Date  . Arthritis   . Asthma   . GERD (gastroesophageal reflux disease)   . HLD (hyperlipidemia)   . HTN (hypertension)   . Hypertension   . Type 2 diabetes mellitus [facility](HCC)     Patient Active Problem List   Diagnosis Date Noted  . Syncope 09/06/2015  . HTN (hypertension) 09/06/2015  . HLD (hyperlipidemia) 09/06/2015  . Type 2 diabetes mellitus (HCC) 09/06/2015  . GERD (gastroesophageal reflux disease) 09/06/2015    Past Surgical History:  Procedure Laterality Date  . left toe surgery      Prior to Admission medications   Medication Sig Start Date End Date Taking? Authorizing Provider  albuterol (PROAIR HFA) 108 (90 Base) MCG/ACT inhaler Inhale 2 puffs into the lungs every 6 (six) hours as needed. 01/30/15 01/30/16  Historical Provider, MD  amLODipine (NORVASC) 10 MG tablet Take 10 mg by mouth daily. 07/28/15   Historical Provider, MD  aspirin EC 81 MG tablet Take 1 tablet by mouth daily.    Historical Provider, MD  esomeprazole (NEXIUM) 40 MG capsule Take 1 capsule by mouth daily.    Historical Provider, MD  glipiZIDE (GLUCOTROL XL) 10 MG  24 hr tablet Take 1 tablet by mouth daily. 07/28/15   Historical Provider, MD  HYDROcodone-acetaminophen (NORCO/VICODIN) 5-325 MG tablet Take 1 tablet by mouth every 4 (four) hours as needed for moderate pain. 03/26/16   Tommi Rumpshonda L Tammy Wickliffe, PA-C  LEVEMIR FLEXTOUCH 100 UNIT/ML Pen Inject 60 Units into the skin at bedtime. 06/29/15   Historical Provider, MD  lovastatin (MEVACOR) 20 MG tablet Take 1 tablet by mouth daily. 07/28/15   Historical Provider, MD  metFORMIN (GLUCOPHAGE-XR) 500 MG 24 hr tablet Take 1 tablet by mouth daily. 07/28/15   Historical Provider, MD  sitaGLIPtin (JANUVIA) 100 MG tablet Take 1 tablet by mouth daily. 01/30/15   Historical Provider, MD  sulfamethoxazole-trimethoprim (BACTRIM DS,SEPTRA DS) 800-160 MG tablet Take 1 tablet by mouth 2 (two) times daily. 03/26/16   Tommi Rumpshonda L Alonna Bartling, PA-C  valsartan-hydrochlorothiazide (DIOVAN-HCT) 160-25 MG tablet Take 1 tablet by mouth daily. 07/28/15   Historical Provider, MD    Allergies Review of patient's allergies indicates no known allergies.  Family History  Problem Relation Age of Onset  . CAD Father   . CVA Mother     Social History Social History  Substance Use Topics  . Smoking status: Never Smoker  . Smokeless tobacco: Never Used  . Alcohol use No    Review of Systems Constitutional: No fever/chills Cardiovascular: Denies chest pain. Respiratory: Denies shortness of breath. Gastrointestinal:  No nausea, no vomiting.  Musculoskeletal: Negative for back pain. Skin: Positive for  skin abscesses. Neurological: Negative for headaches, focal weakness or numbness.  10-point ROS otherwise negative.  ____________________________________________   PHYSICAL EXAM:  VITAL SIGNS: ED Triage Vitals  Enc Vitals Group     BP 03/26/16 0918 (!) 163/52     Pulse Rate 03/26/16 0918 65     Resp 03/26/16 0918 16     Temp 03/26/16 0918 98.2 F (36.8 C)     Temp src --      SpO2 03/26/16 0918 98 %     Weight 03/26/16 0919 240  lb (108.9 kg)     Height 03/26/16 0919 5\' 4"  (1.626 m)     Head Circumference --      Peak Flow --      Pain Score --      Pain Loc --      Pain Edu? --      Excl. in GC? --     Constitutional: Alert and oriented. Well appearing and in no acute distress. Eyes: Conjunctivae are normal. PERRL. EOMI. Head: Atraumatic. Nose: No congestion/rhinnorhea. Neck: No stridor.   Cardiovascular: Normal rate, regular rhythm. Grossly normal heart sounds.  Good peripheral circulation. Respiratory: Normal respiratory effort.  No retractions. Lungs CTAB. Musculoskeletal: Moves upper and lower extremities without difficulty. Normal gait was noted. Neurologic:  Normal speech and language. No gross focal neurologic deficits are appreciated. No gait instability. Skin:  Skin is warm, dry. There is a 2 and half centimeter tender mildly erythematous area on the upper inner thigh with some mild fluctuance. Psychiatric: Mood and affect are normal. Speech and behavior are normal.  ____________________________________________   LABS (all labs ordered are listed, but only abnormal results are displayed)  Labs Reviewed - No data to display  PROCEDURES  Procedure(s) performed: INCISION AND DRAINAGE Performed by: Tommi Rumpshonda L [place] Cork Consent: Verbal consent obtained. Risks and benefits: risks, benefits and alternatives were discussed Type: abscess  Body area: Left inner thigh  Anesthesia: local infiltration  Incision was made with a scalpel.  Local anesthetic: lidocaine 1 % without epinephrine  Anesthetic total: 2 ml  Complexity: complex Blunt dissection to break up loculations  Drainage: purulent  Drainage amount: Moderate   Packing material: 1/4 in iodoform gauze  Patient tolerance: Patient tolerated the procedure well with no immediate complications.    Procedures  Critical Care performed: No  ____________________________________________   INITIAL IMPRESSION / ASSESSMENT AND PLAN / ED  COURSE  Pertinent labs & imaging results that were available during my care of the patient were reviewed by me and considered in my medical decision making (see chart for details).    Clinical Course  Patient is given prescription for Bactrim DS twice a day for 10 days along with a prescription for Norco as needed for pain. Patient is to follow-up with her primary care doctor or return to the emergency room in 2 days for packing removal.   ____________________________________________   FINAL CLINICAL IMPRESSION(S) / ED DIAGNOSES  Final diagnoses:  Abscess      NEW MEDICATIONS STARTED DURING THIS VISIT:  Discharge Medication List as of 03/26/2016 10:38 AM    START taking these medications   Details  sulfamethoxazole-trimethoprim (BACTRIM DS,SEPTRA DS) 800-160 MG tablet Take 1 tablet by mouth 2 (two) times daily., Starting Wed 03/26/2016, Print         Note:  This document was prepared using Dragon voice recognition software and may include unintentional dictation errors.    Tommi Rumpshonda L Lucillia Corson, PA-C 03/26/16 1118    Bobetta LimeEryka  Lavena Stanford, MD 03/26/16 [phone]

## 2016-03-26 NOTE — Discharge Instructions (Signed)
Begin taking antibiotics. You may also take pain medication if needed. Be aware that this medication can cause drowsiness increase your  risk for falling. Follow-up return as clinic or return to the emergency room for removal of the drainage 2 days.

## 2016-05-06 DIAGNOSIS — E889 Metabolic disorder, unspecified: Secondary | ICD-10-CM | POA: Diagnosis not present

## 2016-05-06 DIAGNOSIS — E11610 Type 2 diabetes mellitus with diabetic neuropathic arthropathy: Secondary | ICD-10-CM | POA: Diagnosis not present

## 2016-05-06 DIAGNOSIS — J40 Bronchitis, not specified as acute or chronic: Secondary | ICD-10-CM | POA: Diagnosis not present

## 2016-05-06 DIAGNOSIS — E781 Pure hyperglyceridemia: Secondary | ICD-10-CM | POA: Diagnosis not present

## 2016-05-23 DIAGNOSIS — H2513 Age-related nuclear cataract, bilateral: Secondary | ICD-10-CM | POA: Diagnosis not present

## 2016-05-26 DIAGNOSIS — E11610 Type 2 diabetes mellitus with diabetic neuropathic arthropathy: Secondary | ICD-10-CM | POA: Diagnosis not present

## 2016-05-26 DIAGNOSIS — E781 Pure hyperglyceridemia: Secondary | ICD-10-CM | POA: Diagnosis not present

## 2016-05-26 DIAGNOSIS — I119 Hypertensive heart disease without heart failure: Secondary | ICD-10-CM | POA: Diagnosis not present

## 2016-08-08 ENCOUNTER — Emergency Department: Payer: Medicare Other

## 2016-08-08 ENCOUNTER — Emergency Department
Admission: EM | Admit: 2016-08-08 | Discharge: 2016-08-08 | Disposition: A | Discharge status: Home / Self Care | Payer: Medicare Other | Attending: Emergency Medicine | Admitting: Emergency Medicine

## 2016-08-08 DIAGNOSIS — E119 Type 2 diabetes mellitus without complications: Secondary | ICD-10-CM | POA: Insufficient documentation

## 2016-08-08 DIAGNOSIS — Z7982 Long term (current) use of aspirin: Secondary | ICD-10-CM | POA: Diagnosis not present

## 2016-08-08 DIAGNOSIS — I10 Essential (primary) hypertension: Secondary | ICD-10-CM | POA: Diagnosis not present

## 2016-08-08 DIAGNOSIS — J45909 Unspecified asthma, uncomplicated: Secondary | ICD-10-CM | POA: Diagnosis not present

## 2016-08-08 DIAGNOSIS — J209 Acute bronchitis, unspecified: Secondary | ICD-10-CM | POA: Diagnosis not present

## 2016-08-08 DIAGNOSIS — Z79899 Other long term (current) drug therapy: Secondary | ICD-10-CM | POA: Diagnosis not present

## 2016-08-08 DIAGNOSIS — Z7984 Long term (current) use of oral hypoglycemic drugs: Secondary | ICD-10-CM | POA: Diagnosis not present

## 2016-08-08 DIAGNOSIS — R05 Cough: Secondary | ICD-10-CM | POA: Diagnosis not present

## 2016-08-08 LAB — INFLUENZA PANEL BY PCR (TYPE A & B)
INFLAPCR: NEGATIVE
INFLBPCR: NEGATIVE

## 2016-08-08 LAB — CBC
HCT: 42.1 % (ref 35.0–47.0)
Hemoglobin: 14.3 g/dL (ref 12.0–16.0)
MCH: 27.7 pg (ref 26.0–34.0)
MCHC: 34 g/dL (ref 32.0–36.0)
MCV: 81.5 fL (ref 80.0–100.0)
PLATELETS: 175 10*3/uL (ref 150–440)
RBC: 5.17 MIL/uL (ref 3.80–5.20)
RDW: 12.8 % (ref 11.5–14.5)
WBC: 11.8 10*3/uL — AB (ref 3.6–11.0)

## 2016-08-08 LAB — BASIC METABOLIC PANEL
ANION GAP: 12 (ref 5–15)
BUN: 11 mg/dL (ref 6–20)
CALCIUM: 8.7 mg/dL — AB (ref 8.9–10.3)
CO2: 21 mmol/L — ABNORMAL LOW (ref 22–32)
CREATININE: 0.85 mg/dL (ref 0.44–1.00)
Chloride: 101 mmol/L (ref 101–111)
GLUCOSE: 415 mg/dL — AB (ref 65–99)
Potassium: 3.7 mmol/L (ref 3.5–5.1)
Sodium: 134 mmol/L — ABNORMAL LOW (ref 135–145)

## 2016-08-08 LAB — TROPONIN I: Troponin I: <0.03 ng/mL (ref <0.03)

## 2016-08-08 LAB — GLUCOSE, CAPILLARY
Glucose-Capillary: 372 mg/dL — ABNORMAL HIGH (ref 65–99)
Glucose-Capillary: 324 mg/dL — ABNORMAL HIGH (ref 65–99)

## 2016-08-08 MED ORDER — HYDROCOD POLST-CPM POLST ER 10-8 MG/5ML PO SUER: 5.0000 mL | Freq: Two times a day (BID) | ORAL | 0 refills | Status: DC | PRN

## 2016-08-08 MED ORDER — AZITHROMYCIN 250 MG PO TABS: ORAL_TABLET | ORAL | 0 refills | Status: AC

## 2016-08-08 MED ORDER — INSULIN ASPART 100 UNIT/ML ~~LOC~~ SOLN
8.0000 [IU] | Freq: Once | SUBCUTANEOUS | Status: AC
  Administered 2016-08-08: 8 [IU] via SUBCUTANEOUS
  Filled 2016-08-08: qty 8

## 2016-08-08 MED ORDER — AZITHROMYCIN 500 MG PO TABS
500.0000 mg | ORAL_TABLET | Freq: Once | ORAL | Status: AC
  Administered 2016-08-08: 500 mg via ORAL
  Filled 2016-08-08: qty 1

## 2016-08-08 MED ORDER — HYDROCOD POLST-CPM POLST ER 10-8 MG/5ML PO SUER
5.0000 mL | Freq: Once | ORAL | Status: AC
  Administered 2016-08-08: 5 mL via ORAL
  Filled 2016-08-08: qty 5

## 2016-08-08 NOTE — ED Triage Notes (Signed)
Pt in with co cough x 3 days states coughing up yellow sputum. States has had blood tinged sputum, has been shob.

## 2016-08-08 NOTE — ED Provider Notes (Signed)
[facility] Emergency Department Provider Note   First MD Initiated Contact with Patient 08/08/16 0255     (approximate)  I have reviewed the triage vital signs and the nursing notes.   HISTORY  Chief Complaint Cough    HPI Mary Hodges is a 75 y.o. female presents with three-day history of productive cough with yellow sputum congestion and dyspnea. Patient denies any fever afebrile here with a temperature of 98.6. Patient admits to being around sick contact when she visited her family members nursing home.   Past Medical History:  Diagnosis Date  . Arthritis   . Asthma   . GERD (gastroesophageal reflux disease)   . HLD (hyperlipidemia)   . HTN (hypertension)   . Hypertension   . Type 2 diabetes mellitus [facility](HCC)     Patient Active Problem List   Diagnosis Date Noted  . Syncope 09/06/2015  . HTN (hypertension) 09/06/2015  . HLD (hyperlipidemia) 09/06/2015  . Type 2 diabetes mellitus (HCC) 09/06/2015  . GERD (gastroesophageal reflux disease) 09/06/2015    Past Surgical History:  Procedure Laterality Date  . left toe surgery      Prior to Admission medications   Medication Sig Start Date End Date Taking? Authorizing Provider  albuterol (PROAIR HFA) 108 (90 Base) MCG/ACT inhaler Inhale 2 puffs into the lungs every 6 (six) hours as needed. 01/30/15 01/30/16  Historical Provider, MD  amLODipine (NORVASC) 10 MG tablet Take 10 mg by mouth daily. 07/28/15   Historical Provider, MD  aspirin EC 81 MG tablet Take 1 tablet by mouth daily.    Historical Provider, MD  esomeprazole (NEXIUM) 40 MG capsule Take 1 capsule by mouth daily.    Historical Provider, MD  glipiZIDE (GLUCOTROL XL) 10 MG 24 hr tablet Take 1 tablet by mouth daily. 07/28/15   Historical Provider, MD  HYDROcodone-acetaminophen (NORCO/VICODIN) 5-325 MG tablet Take 1 tablet by mouth every 4 (four) hours as needed for moderate pain. 03/26/16   Tommi Rumpshonda L Summers, PA-C  LEVEMIR FLEXTOUCH 100  UNIT/ML Pen Inject 60 Units into the skin at bedtime. 06/29/15   Historical Provider, MD  lovastatin (MEVACOR) 20 MG tablet Take 1 tablet by mouth daily. 07/28/15   Historical Provider, MD  metFORMIN (GLUCOPHAGE-XR) 500 MG 24 hr tablet Take 1 tablet by mouth daily. 07/28/15   Historical Provider, MD  sitaGLIPtin (JANUVIA) 100 MG tablet Take 1 tablet by mouth daily. 01/30/15   Historical Provider, MD  sulfamethoxazole-trimethoprim (BACTRIM DS,SEPTRA DS) 800-160 MG tablet Take 1 tablet by mouth 2 (two) times daily. 03/26/16   Tommi Rumpshonda L Summers, PA-C  valsartan-hydrochlorothiazide (DIOVAN-HCT) 160-25 MG tablet Take 1 tablet by mouth daily. 07/28/15   Historical Provider, MD    Allergies Patient has no known allergies.  Family History  Problem Relation Age of Onset  . CAD Father   . CVA Mother     Social History Social History  Substance Use Topics  . Smoking status: Never Smoker  . Smokeless tobacco: Never Used  . Alcohol use No    Review of Systems Constitutional: No fever/chills Eyes: No visual changes. ENT: No sore throat. Cardiovascular: Denies chest pain. Respiratory: Denies shortness of breath.Positive for cough Gastrointestinal: No abdominal pain.  No nausea, no vomiting.  No diarrhea.  No constipation. Genitourinary: Negative for dysuria. Musculoskeletal: Negative for back pain. Skin: Negative for rash. Neurological: Negative for headaches, focal weakness or numbness.  10-point ROS otherwise negative.  ____________________________________________   PHYSICAL EXAM:  VITAL SIGNS: ED Triage Vitals  Enc Vitals Group     BP 08/08/16 0044 122/83     Pulse Rate 08/08/16 0044 83     Resp 08/08/16 0044 20     Temp 08/08/16 0044 98.6 F (37 C)     Temp Source 08/08/16 0044 Oral     SpO2 08/08/16 0044 96 %     Weight 08/08/16 0044 235 lb (106.6 kg)     Height 08/08/16 0044 5\' 3"  (1.6 m)     Head Circumference --      Peak Flow --      Pain Score 08/08/16 0045 10      Pain Loc --      Pain Edu? --      Excl. in GC? --     Constitutional: Alert and oriented. Well appearing and in no acute distress. Eyes: Conjunctivae are normal. PERRL. EOMI. Head: Atraumatic. Ears:  Healthy appearing ear canals and TMs bilaterally Nose: No congestion/rhinnorhea. Mouth/Throat: Mucous membranes are moist.  Oropharynx non-erythematous. Neck: No stridor.   Cardiovascular: Normal rate, regular rhythm. Good peripheral circulation. Grossly normal heart sounds. Respiratory: Normal respiratory effort.  No retractions. Lungs CTAB. Gastrointestinal: Soft and nontender. No distention.  Musculoskeletal: No lower extremity tenderness nor edema. No gross deformities of extremities. Neurologic:  Normal speech and language. No gross focal neurologic deficits are appreciated.  Skin:  Skin is warm, dry and intact. No rash noted. Psychiatric: Mood and affect are normal. Speech and behavior are normal.  ____________________________________________   LABS (all labs ordered are listed, but only abnormal results are displayed)  Labs Reviewed  CBC - Abnormal; Notable for the following:       Result Value   WBC 11.8 (*)    All other components within normal limits  BASIC METABOLIC PANEL - Abnormal; Notable for the following:    Sodium 134 (*)    CO2 21 (*)    Glucose, Bld 415 (*)    Calcium 8.7 (*)    All other components within normal limits  GLUCOSE, CAPILLARY - Abnormal; Notable for the following:    Glucose-Capillary 372 (*)    All other components within normal limits  TROPONIN I  INFLUENZA PANEL BY PCR (TYPE A & B, H1N1)   ____________________________________________  EKG  ED ECG REPORT I, Clayton N Saba Gomm, the attending physician, personally viewed and interpreted this ECG.   Date: 08/08/2016  EKG Time: 12:52 AM  Rate: 80  Rhythm: Normal sinus rhythm  Axis: Normal  Intervals: Normal  ST&T Change:  None  ____________________________________________  RADIOLOGY I, Wyocena N Adyline Huberty, personally viewed and evaluated these images (plain radiographs) as part of my medical decision making, as well as reviewing the written report by the radiologist.  Dg Chest 2 View  Result Date: 08/08/2016 CLINICAL DATA:  Acute onset of productive cough. Shortness of breath. Initial encounter. EXAM: CHEST  2 VIEW COMPARISON:  Chest radiograph performed 08/26/2011 FINDINGS: The lungs are hypoexpanded. Vascular congestion is noted. There is no evidence of focal opacification, pleural effusion or pneumothorax. The heart is borderline enlarged. No acute osseous abnormalities are seen. IMPRESSION: Lungs hypoexpanded. Vascular congestion and borderline cardiomegaly. Lungs remain grossly clear. Electronically Signed   By: Roanna Raider M.D.   On: 08/08/2016 01:15     Procedures    INITIAL IMPRESSION / ASSESSMENT AND PLAN / ED COURSE  Pertinent labs & imaging results that were available during my care of the patient were reviewed by me and considered in my medical decision making (see  chart for details).  History of physical exam consistent with acute bronchitis which patient states that she has had in the past. Patient given Tussionex for cough as well as azithromycin will be prescribed same for home   Clinical Course     ____________________________________________  FINAL CLINICAL IMPRESSION(S) / ED DIAGNOSES  Final diagnoses:  Acute bronchitis, unspecified organism     MEDICATIONS GIVEN DURING THIS VISIT:  Medications  azithromycin (ZITHROMAX) tablet 500 mg (not administered)  chlorpheniramine-HYDROcodone (TUSSIONEX) 10-8 MG/5ML suspension 5 mL (5 mLs Oral Given 08/08/16 0316)  insulin aspart (novoLOG) injection 8 Units (8 Units Subcutaneous Given 08/08/16 0345)     NEW OUTPATIENT MEDICATIONS STARTED DURING THIS VISIT:  New Prescriptions   No medications on file    Modified Medications    No medications on file    Discontinued Medications   No medications on file     Note:  This document was prepared using Dragon voice recognition software and may include unintentional dictation errors.    Darci Currentandolph N Bonita Brindisi, MD 08/08/16 [phone]

## 2016-08-08 NOTE — ED Notes (Signed)
Ok to discharge per Dr. Manson PasseyBrown.

## 2016-08-18 DIAGNOSIS — E889 Metabolic disorder, unspecified: Secondary | ICD-10-CM | POA: Diagnosis not present

## 2016-08-18 DIAGNOSIS — E1169 Type 2 diabetes mellitus with other specified complication: Secondary | ICD-10-CM | POA: Diagnosis not present

## 2016-08-18 DIAGNOSIS — J219 Acute bronchiolitis, unspecified: Secondary | ICD-10-CM | POA: Diagnosis not present

## 2016-08-18 DIAGNOSIS — E11610 Type 2 diabetes mellitus with diabetic neuropathic arthropathy: Secondary | ICD-10-CM | POA: Diagnosis not present

## 2016-10-13 DIAGNOSIS — E781 Pure hyperglyceridemia: Secondary | ICD-10-CM | POA: Diagnosis not present

## 2016-10-13 DIAGNOSIS — E889 Metabolic disorder, unspecified: Secondary | ICD-10-CM | POA: Diagnosis not present

## 2016-10-13 DIAGNOSIS — E11610 Type 2 diabetes mellitus with diabetic neuropathic arthropathy: Secondary | ICD-10-CM | POA: Diagnosis not present

## 2016-10-13 DIAGNOSIS — J219 Acute bronchiolitis, unspecified: Secondary | ICD-10-CM | POA: Diagnosis not present

## 2017-02-24 DIAGNOSIS — E781 Pure hyperglyceridemia: Secondary | ICD-10-CM | POA: Diagnosis not present

## 2017-02-24 DIAGNOSIS — E11610 Type 2 diabetes mellitus with diabetic neuropathic arthropathy: Secondary | ICD-10-CM | POA: Diagnosis not present

## 2017-02-24 DIAGNOSIS — E1169 Type 2 diabetes mellitus with other specified complication: Secondary | ICD-10-CM | POA: Diagnosis not present

## 2017-02-24 DIAGNOSIS — E119 Type 2 diabetes mellitus without complications: Secondary | ICD-10-CM | POA: Diagnosis not present

## 2017-05-26 DIAGNOSIS — E1169 Type 2 diabetes mellitus with other specified complication: Secondary | ICD-10-CM | POA: Diagnosis not present

## 2017-05-26 DIAGNOSIS — E119 Type 2 diabetes mellitus without complications: Secondary | ICD-10-CM | POA: Diagnosis not present

## 2017-05-26 DIAGNOSIS — E11610 Type 2 diabetes mellitus with diabetic neuropathic arthropathy: Secondary | ICD-10-CM | POA: Diagnosis not present

## 2017-05-26 DIAGNOSIS — E889 Metabolic disorder, unspecified: Secondary | ICD-10-CM | POA: Diagnosis not present

## 2017-06-18 ENCOUNTER — Emergency Department
Admission: EM | Admit: 2017-06-18 | Discharge: 2017-06-18 | Disposition: A | Discharge status: Home / Self Care | Payer: Medicare Other | Attending: Emergency Medicine | Admitting: Emergency Medicine

## 2017-06-18 ENCOUNTER — Encounter: Payer: Self-pay | Admitting: Emergency Medicine

## 2017-06-18 DIAGNOSIS — N611 Abscess of the breast and nipple: Secondary | ICD-10-CM | POA: Insufficient documentation

## 2017-06-18 DIAGNOSIS — Z7984 Long term (current) use of oral hypoglycemic drugs: Secondary | ICD-10-CM | POA: Diagnosis not present

## 2017-06-18 DIAGNOSIS — Z79899 Other long term (current) drug therapy: Secondary | ICD-10-CM | POA: Insufficient documentation

## 2017-06-18 DIAGNOSIS — R600 Localized edema: Secondary | ICD-10-CM | POA: Diagnosis present

## 2017-06-18 DIAGNOSIS — I10 Essential (primary) hypertension: Secondary | ICD-10-CM | POA: Diagnosis not present

## 2017-06-18 DIAGNOSIS — Z7982 Long term (current) use of aspirin: Secondary | ICD-10-CM | POA: Diagnosis not present

## 2017-06-18 DIAGNOSIS — J45909 Unspecified asthma, uncomplicated: Secondary | ICD-10-CM | POA: Diagnosis not present

## 2017-06-18 DIAGNOSIS — E119 Type 2 diabetes mellitus without complications: Secondary | ICD-10-CM | POA: Diagnosis not present

## 2017-06-18 DIAGNOSIS — L0291 Cutaneous abscess, unspecified: Secondary | ICD-10-CM

## 2017-06-18 DIAGNOSIS — L02213 Cutaneous abscess of chest wall: Secondary | ICD-10-CM | POA: Diagnosis not present

## 2017-06-18 MED ORDER — HYDROCODONE-ACETAMINOPHEN 5-325 MG PO TABS: 1.0000 | ORAL_TABLET | Freq: Four times a day (QID) | ORAL | 0 refills | Status: DC | PRN

## 2017-06-18 MED ORDER — LIDOCAINE HCL (PF) 1 % IJ SOLN
5.0000 mL | Freq: Once | INTRAMUSCULAR | Status: DC
  Filled 2017-06-18: qty 5

## 2017-06-18 MED ORDER — SULFAMETHOXAZOLE-TRIMETHOPRIM 800-160 MG PO TABS: 1.0000 | ORAL_TABLET | Freq: Two times a day (BID) | ORAL | 0 refills | Status: DC

## 2017-06-18 MED ORDER — HYDROCODONE-ACETAMINOPHEN 5-325 MG PO TABS
1.0000 | ORAL_TABLET | Freq: Once | ORAL | Status: AC
  Administered 2017-06-18: 1 via ORAL
  Filled 2017-06-18: qty 1

## 2017-06-18 NOTE — ED Notes (Signed)
Pt discharged home after verbalizing understanding of discharge instructions; nad noted. 

## 2017-06-18 NOTE — ED Provider Notes (Signed)
[facility] Emergency Department Provider Note  ____________________________________________   First MD Initiated Contact with Patient 06/18/17 1131     (approximate)  I have reviewed the triage vital signs and the nursing notes.   HISTORY  Chief Complaint Abscess   HPI Mary Hodges is a 76 y.o. female  is here with complaint of abscess under her right breast for one week. Patient states that she has had this before. Currently she has been using warm compresses hoping that it would open up and drain on its on. She denies any fever or chills. Patient states that several years ago she had an abscess in the very same area. She requests pain medication prior to I&D. She states that someone dropped her off. Currently she rates her pain as 10 over 10.   Past Medical History:  Diagnosis Date  . Arthritis   . Asthma   . GERD (gastroesophageal reflux disease)   . HLD (hyperlipidemia)   . HTN (hypertension)   . Hypertension   . Type 2 diabetes mellitus [facility](HCC)     Patient Active Problem List   Diagnosis Date Noted  . Syncope 09/06/2015  . HTN (hypertension) 09/06/2015  . HLD (hyperlipidemia) 09/06/2015  . Type 2 diabetes mellitus (HCC) 09/06/2015  . GERD (gastroesophageal reflux disease) 09/06/2015    Past Surgical History:  Procedure Laterality Date  . left toe surgery      Prior to Admission medications   Medication Sig Start Date End Date Taking? Authorizing Provider  albuterol (PROAIR HFA) 108 (90 Base) MCG/ACT inhaler Inhale 2 puffs into the lungs every 6 (six) hours as needed. 01/30/15 01/30/16  [provider]  amLODipine (NORVASC) 10 MG tablet Take 10 mg by mouth daily. 07/28/15   [provider]  aspirin EC 81 MG tablet Take 1 tablet by mouth daily.    [provider]  esomeprazole (NEXIUM) 40 MG capsule Take 1 capsule by mouth daily.    [provider]  glipiZIDE (GLUCOTROL XL) 10 MG 24 hr tablet Take 1  tablet by mouth daily. 07/28/15   [provider]  HYDROcodone-acetaminophen (NORCO/VICODIN) 5-325 MG tablet Take 1 tablet every 6 (six) hours as needed by mouth for moderate pain. 06/18/17   Tommi RumpsSummers, Polette Nofsinger L, PA-C  LEVEMIR FLEXTOUCH 100 UNIT/ML Pen Inject 60 Units into the skin at bedtime. 06/29/15   [provider]  lovastatin (MEVACOR) 20 MG tablet Take 1 tablet by mouth daily. 07/28/15   [provider]  metFORMIN (GLUCOPHAGE-XR) 500 MG 24 hr tablet Take 1 tablet by mouth daily. 07/28/15   [provider]  sitaGLIPtin (JANUVIA) 100 MG tablet Take 1 tablet by mouth daily. 01/30/15   [provider]  sulfamethoxazole-trimethoprim (BACTRIM DS,SEPTRA DS) 800-160 MG tablet Take 1 tablet 2 (two) times daily by mouth. 06/18/17   Tommi RumpsSummers, Inioluwa Boulay L, PA-C  valsartan-hydrochlorothiazide (DIOVAN-HCT) 160-25 MG tablet Take 1 tablet by mouth daily. 07/28/15   [provider]    Allergies Patient has no known allergies.  Family History  Problem Relation Age of Onset  . CAD Father   . CVA Mother     Social History Social History   Tobacco Use  . Smoking status: Never Smoker  . Smokeless tobacco: Never Used  Substance Use Topics  . Alcohol use: No    Alcohol/week: 0.0 oz  . Drug use: No    Review of Systems Constitutional: No fever/chills Cardiovascular: Denies chest pain. Respiratory: Denies shortness of breath. Gastrointestinal:  No nausea, no vomiting.  Musculoskeletal: Negative for back pain. Skin: positive for abscesses. Neurological: Negative for headaches, focal weakness or numbness. ____________________________________________   PHYSICAL EXAM:  VITAL SIGNS: ED Triage Vitals  Enc Vitals Group     BP 06/18/17 1124 (!) 190/76     Pulse Rate 06/18/17 1124 77     Resp 06/18/17 1124 16     Temp 06/18/17 1124 (!) 97.5 F (36.4 C)     Temp Source 06/18/17 1124 Oral     SpO2 06/18/17 1124 99 %     Weight 06/18/17 1122 213 lb  (96.6 kg)     Height 06/18/17 1122 5\' 4"  (1.626 m)     Head Circumference --      Peak Flow --      Pain Score 06/18/17 1122 10     Pain Loc --      Pain Edu? --      Excl. in GC? --    Constitutional: Alert and oriented. Well appearing and in no acute distress. Eyes: Conjunctivae are normal.  Head: Atraumatic. Neck: No stridor.   Cardiovascular: Normal rate, regular rhythm. Grossly normal heart sounds.  Good peripheral circulation. Respiratory: Normal respiratory effort.  No retractions. Lungs CTAB. Musculoskeletal: No lower extremity tenderness nor edema.  No joint effusions. Neurologic:  Normal speech and language. No gross focal neurologic deficits are appreciated. No gait instability. Skin:  Skin is warm, dry and intact. No rash noted. Psychiatric: Mood and affect are normal. Speech and behavior are normal.  ____________________________________________   LABS (all labs ordered are listed, but only abnormal results are displayed)  Labs Reviewed - No data to display   PROCEDURES  Procedure(s) performed: INCISION AND DRAINAGE Performed by: Tommi Rumpshonda L Helena Sardo Consent: Verbal consent obtained. Risks and benefits: risks, benefits and alternatives were discussed Type: abscess  Body area: right chest under right breast  Anesthesia: local infiltration  Incision was made with a scalpel.  Local anesthetic: lidocaine 1% without epinephrine  Anesthetic total: 2.5 ml  Complexity: complex Blunt dissection to break up loculations  Drainage: purulent  Drainage amount: large  Packing material: 1/4 in iodoform gauze  Patient tolerance: Patient tolerated the procedure well with no immediate complications.    Procedures  Critical Care performed: No  ____________________________________________   INITIAL IMPRESSION / ASSESSMENT AND PLAN / ED COURSE  Patient was given Norco prior to procedure. Patient did well with a large amount of purulent drainage. Area was packed.  She is aware that she needs to have this packing removed in 2 days. She is encouraged to follow up with urgent care or return to the emergency department since her PCP will be closed over the weekend. She is given a prescription for Bactrim DS twice a day for 10 days and Norco as needed for pain.  ____________________________________________   FINAL CLINICAL IMPRESSION(S) / ED DIAGNOSES  Final diagnoses:  Abscess     ED Discharge Orders        Ordered    sulfamethoxazole-trimethoprim (BACTRIM DS,SEPTRA DS) 800-160 MG tablet  2 times daily     06/18/17 1346    HYDROcodone-acetaminophen (NORCO/VICODIN) 5-325 MG tablet  Every 6 hours PRN     06/18/17 1346       Note:  This document was prepared using Dragon voice recognition software and may include unintentional dictation errors.    Tommi RumpsSummers, Arianah Torgeson L, PA-C 06/18/17 1429    Minna AntisPaduchowski, Kevin, MD 06/18/17 [phone]

## 2017-06-18 NOTE — Discharge Instructions (Signed)
Begin taking antibiotics as directed. Take pain medication 1 tablet every 6 hours if needed for pain. This medication could cause drowsiness increase your risk for falling. You will need to return to the emergency Department or go to an urgent care to have packing removed in 2 days. Follow-up with your primary care provider if any continued problems.

## 2017-06-18 NOTE — ED Triage Notes (Signed)
Pt to ED via POV with c/o abscess under RT breast x1wk. Denies fever

## 2017-06-19 ENCOUNTER — Other Ambulatory Visit: Payer: Self-pay

## 2017-06-19 NOTE — Patient Outreach (Signed)
Outreach patient after ED [facility].  I spoke with the patient and verified her PCP is current.  She is scheduling follow up on Monday Doctors office is closed today.  I was able to briefly explain [facility] services and 24 Hour Nurse Advice line.  She said she was ok, the ER gave her a lot of information and she will reach out if she needs anything.  I explained that I would mail the number to the 24 Hour Nurse Advice line and information explaining [facility] services to her.  She said that is fine.

## 2017-08-26 DIAGNOSIS — E119 Type 2 diabetes mellitus without complications: Secondary | ICD-10-CM | POA: Diagnosis not present

## 2017-08-26 DIAGNOSIS — I119 Hypertensive heart disease without heart failure: Secondary | ICD-10-CM | POA: Diagnosis not present

## 2017-08-26 DIAGNOSIS — E781 Pure hyperglyceridemia: Secondary | ICD-10-CM | POA: Diagnosis not present

## 2017-08-26 DIAGNOSIS — E11610 Type 2 diabetes mellitus with diabetic neuropathic arthropathy: Secondary | ICD-10-CM | POA: Diagnosis not present

## 2017-11-10 DIAGNOSIS — I119 Hypertensive heart disease without heart failure: Secondary | ICD-10-CM | POA: Diagnosis not present

## 2017-11-10 DIAGNOSIS — E781 Pure hyperglyceridemia: Secondary | ICD-10-CM | POA: Diagnosis not present

## 2017-11-10 DIAGNOSIS — E11610 Type 2 diabetes mellitus with diabetic neuropathic arthropathy: Secondary | ICD-10-CM | POA: Diagnosis not present

## 2017-11-10 DIAGNOSIS — M1990 Unspecified osteoarthritis, unspecified site: Secondary | ICD-10-CM | POA: Diagnosis not present

## 2018-02-19 DIAGNOSIS — E119 Type 2 diabetes mellitus without complications: Secondary | ICD-10-CM | POA: Diagnosis not present

## 2018-02-19 DIAGNOSIS — E781 Pure hyperglyceridemia: Secondary | ICD-10-CM | POA: Diagnosis not present

## 2018-02-19 DIAGNOSIS — I119 Hypertensive heart disease without heart failure: Secondary | ICD-10-CM | POA: Diagnosis not present

## 2018-02-19 DIAGNOSIS — E11610 Type 2 diabetes mellitus with diabetic neuropathic arthropathy: Secondary | ICD-10-CM | POA: Diagnosis not present

## 2018-03-05 DIAGNOSIS — M1990 Unspecified osteoarthritis, unspecified site: Secondary | ICD-10-CM | POA: Diagnosis not present

## 2018-03-05 DIAGNOSIS — I119 Hypertensive heart disease without heart failure: Secondary | ICD-10-CM | POA: Diagnosis not present

## 2018-03-05 DIAGNOSIS — E781 Pure hyperglyceridemia: Secondary | ICD-10-CM | POA: Diagnosis not present

## 2018-03-05 DIAGNOSIS — E11610 Type 2 diabetes mellitus with diabetic neuropathic arthropathy: Secondary | ICD-10-CM | POA: Diagnosis not present

## 2018-03-12 DIAGNOSIS — R5381 Other malaise: Secondary | ICD-10-CM | POA: Diagnosis not present

## 2018-03-12 DIAGNOSIS — E034 Atrophy of thyroid (acquired): Secondary | ICD-10-CM | POA: Diagnosis not present

## 2018-03-12 DIAGNOSIS — E119 Type 2 diabetes mellitus without complications: Secondary | ICD-10-CM | POA: Diagnosis not present

## 2018-03-12 DIAGNOSIS — I10 Essential (primary) hypertension: Secondary | ICD-10-CM | POA: Diagnosis not present

## 2018-03-12 DIAGNOSIS — E7849 Other hyperlipidemia: Secondary | ICD-10-CM | POA: Diagnosis not present

## 2018-03-16 DIAGNOSIS — E781 Pure hyperglyceridemia: Secondary | ICD-10-CM | POA: Diagnosis not present

## 2018-03-16 DIAGNOSIS — E119 Type 2 diabetes mellitus without complications: Secondary | ICD-10-CM | POA: Diagnosis not present

## 2018-03-16 DIAGNOSIS — I119 Hypertensive heart disease without heart failure: Secondary | ICD-10-CM | POA: Diagnosis not present

## 2018-03-16 DIAGNOSIS — Z0000 Encounter for general adult medical examination without abnormal findings: Secondary | ICD-10-CM | POA: Diagnosis not present

## 2018-03-16 DIAGNOSIS — E11610 Type 2 diabetes mellitus with diabetic neuropathic arthropathy: Secondary | ICD-10-CM | POA: Diagnosis not present

## 2018-06-30 ENCOUNTER — Other Ambulatory Visit: Payer: Self-pay

## 2018-06-30 DIAGNOSIS — E781 Pure hyperglyceridemia: Secondary | ICD-10-CM | POA: Diagnosis not present

## 2018-06-30 DIAGNOSIS — E889 Metabolic disorder, unspecified: Secondary | ICD-10-CM | POA: Diagnosis not present

## 2018-06-30 DIAGNOSIS — E11610 Type 2 diabetes mellitus with diabetic neuropathic arthropathy: Secondary | ICD-10-CM | POA: Diagnosis not present

## 2018-06-30 NOTE — Patient Outreach (Signed)
[facility]) Care Management  06/30/2018  Charlesetta IvoryMary A Kersten 1940-09-01 [id number]   Medication Adherence call to Mrs. Mary Hodges patient did not answer patient is due on Lovastatin 20 mg under [facility].   Lillia AbedAna Ollison-Moran CPhT Pharmacy Technician [facility] Care Management Direct Dial [phone]  Fax [phone] [email or web address]

## 2018-07-14 ENCOUNTER — Other Ambulatory Visit: Payer: Self-pay

## 2018-07-14 NOTE — Patient Outreach (Signed)
[facility]) Care Management  07/14/2018  Mary IvoryMary A Neuenfeldt 12-05-1940 [id number]   Medication Adherence call to Mrs. Mary Hodges patient did not answer patient is due on Lovastatin 20 mg. Mrs. Mary Hodges is showing past due under [facility].   Mary AbedAna Hodges CPhT Pharmacy Technician [facility] Care Management Direct Dial [phone]  Fax [phone] [email or web address]

## 2018-08-27 DIAGNOSIS — E11610 Type 2 diabetes mellitus with diabetic neuropathic arthropathy: Secondary | ICD-10-CM | POA: Diagnosis not present

## 2018-08-27 DIAGNOSIS — E889 Metabolic disorder, unspecified: Secondary | ICD-10-CM | POA: Diagnosis not present

## 2018-08-27 DIAGNOSIS — M1990 Unspecified osteoarthritis, unspecified site: Secondary | ICD-10-CM | POA: Diagnosis not present

## 2018-12-23 ENCOUNTER — Emergency Department
Admission: EM | Admit: 2018-12-23 | Discharge: 2018-12-23 | Disposition: A | Discharge status: Home / Self Care | Payer: Medicare Other | Attending: Emergency Medicine | Admitting: Emergency Medicine

## 2018-12-23 ENCOUNTER — Other Ambulatory Visit: Payer: Self-pay

## 2018-12-23 DIAGNOSIS — I10 Essential (primary) hypertension: Secondary | ICD-10-CM | POA: Insufficient documentation

## 2018-12-23 DIAGNOSIS — E1165 Type 2 diabetes mellitus with hyperglycemia: Secondary | ICD-10-CM | POA: Insufficient documentation

## 2018-12-23 DIAGNOSIS — Z79899 Other long term (current) drug therapy: Secondary | ICD-10-CM | POA: Insufficient documentation

## 2018-12-23 DIAGNOSIS — R739 Hyperglycemia, unspecified: Secondary | ICD-10-CM

## 2018-12-23 LAB — BASIC METABOLIC PANEL
Anion gap: 10 (ref 5–15)
BUN: 13 mg/dL (ref 8–23)
CO2: 24 mmol/L (ref 22–32)
Calcium: 8.8 mg/dL — ABNORMAL LOW (ref 8.9–10.3)
Chloride: 99 mmol/L (ref 98–111)
Creatinine, Ser: 0.68 mg/dL (ref 0.44–1.00)
GFR calc Af Amer: >60 mL/min (ref >60)
GFR calc non Af Amer: >60 mL/min (ref >60)
Glucose, Bld: 238 mg/dL — ABNORMAL HIGH (ref 70–99)
Potassium: 4.1 mmol/L (ref 3.5–5.1)
Sodium: 133 mmol/L — ABNORMAL LOW (ref 135–145)

## 2018-12-23 LAB — CBC WITH DIFFERENTIAL/PLATELET
Abs Immature Granulocytes: 0.03 10*3/uL (ref 0.00–0.07)
Basophils Absolute: 0 10*3/uL (ref 0.0–0.1)
Basophils Relative: 1 %
Eosinophils Absolute: 0.1 10*3/uL (ref 0.0–0.5)
Eosinophils Relative: 1 %
HCT: 42.4 % (ref 36.0–46.0)
Hemoglobin: 14 g/dL (ref 12.0–15.0)
Immature Granulocytes: 0 %
Lymphocytes Relative: 27 %
Lymphs Abs: 2.3 10*3/uL (ref 0.7–4.0)
MCH: 27.2 pg (ref 26.0–34.0)
MCHC: 33 g/dL (ref 30.0–36.0)
MCV: 82.3 fL (ref 80.0–100.0)
Monocytes Absolute: 0.6 10*3/uL (ref 0.1–1.0)
Monocytes Relative: 7 %
Neutro Abs: 5.5 10*3/uL (ref 1.7–7.7)
Neutrophils Relative %: 64 %
Platelets: 255 10*3/uL (ref 150–400)
RBC: 5.15 MIL/uL — ABNORMAL HIGH (ref 3.87–5.11)
RDW: 12 % (ref 11.5–15.5)
WBC: 8.5 10*3/uL (ref 4.0–10.5)
nRBC: 0 % (ref 0.0–0.2)

## 2018-12-23 LAB — GLUCOSE, CAPILLARY
Glucose-Capillary: 268 mg/dL — ABNORMAL HIGH (ref 70–99)
Glucose-Capillary: 105 mg/dL — ABNORMAL HIGH (ref 70–99)

## 2018-12-23 MED ORDER — SODIUM CHLORIDE 0.9 % IV BOLUS
1000.0000 mL | Freq: Once | INTRAVENOUS | Status: AC
  Administered 2018-12-23: 10:00:00 1000 mL via INTRAVENOUS

## 2018-12-23 NOTE — ED Triage Notes (Signed)
Pt c/o "my sugar has been running high". States it was 290 today and has been having issues with her teeth and they wont pull her tooth if her sugar is high. States she had an appt today with the dentist and tried to go last Friday but they wouldn't while her sugar was high.Marland Kitchen

## 2018-12-23 NOTE — Discharge Instructions (Addendum)
Please seek medical attention for any high fevers, chest pain, shortness of breath, change in behavior, persistent vomiting, bloody stool or any other new or concerning symptoms.  

## 2018-12-23 NOTE — ED Provider Notes (Signed)
[facility] Emergency Department Provider Note  ____________________________________________   I have reviewed the triage vital signs and the nursing notes.   HISTORY  Chief Complaint Hyperglycemia   History limited by: Not Limited   HPI Mary Hodges is a 78 y.o. female who presents to the emergency department today because of concern for high blood pressure and high blood sugar. Patient states she thinks these are related to a tooth issue she has been dealing with since December. Has been trying to see her dentist about the tooth but they will not pull it until she has her blood pressure and sugar under control. She states that normally her blood sugar is in the 130s to 150s but has been running higher since her tooth issue. She did take a course of antibiotics in April for the tooth. Talked to her primary care who put her on short acting insulin recently.    Records reviewed. Per medical record review patient has a history of HLD, HTN  Past Medical History:  Diagnosis Date  . Arthritis   . Asthma   . GERD (gastroesophageal reflux disease)   . HLD (hyperlipidemia)   . HTN (hypertension)   . Hypertension   . Type 2 diabetes mellitus [facility](HCC)     Patient Active Problem List   Diagnosis Date Noted  . Syncope 09/06/2015  . HTN (hypertension) 09/06/2015  . HLD (hyperlipidemia) 09/06/2015  . Type 2 diabetes mellitus (HCC) 09/06/2015  . GERD (gastroesophageal reflux disease) 09/06/2015    Past Surgical History:  Procedure Laterality Date  . left toe surgery      Prior to Admission medications   Medication Sig Start Date End Date Taking? Authorizing Provider  albuterol (PROAIR HFA) 108 (90 Base) MCG/ACT inhaler Inhale 2 puffs into the lungs every 6 (six) hours as needed. 01/30/15 01/30/16  [provider]  amLODipine (NORVASC) 10 MG tablet Take 10 mg by mouth daily. 07/28/15   [provider]  aspirin EC 81 MG tablet Take 1 tablet by  mouth daily.    [provider]  esomeprazole (NEXIUM) 40 MG capsule Take 1 capsule by mouth daily.    [provider]  glipiZIDE (GLUCOTROL XL) 10 MG 24 hr tablet Take 1 tablet by mouth daily. 07/28/15   [provider]  HYDROcodone-acetaminophen (NORCO/VICODIN) 5-325 MG tablet Take 1 tablet every 6 (six) hours as needed by mouth for moderate pain. 06/18/17   Tommi RumpsSummers, Rhonda L, PA-C  LEVEMIR FLEXTOUCH 100 UNIT/ML Pen Inject 60 Units into the skin at bedtime. 06/29/15   [provider]  lovastatin (MEVACOR) 20 MG tablet Take 1 tablet by mouth daily. 07/28/15   [provider]  metFORMIN (GLUCOPHAGE-XR) 500 MG 24 hr tablet Take 1 tablet by mouth daily. 07/28/15   [provider]  sitaGLIPtin (JANUVIA) 100 MG tablet Take 1 tablet by mouth daily. 01/30/15   [provider]  sulfamethoxazole-trimethoprim (BACTRIM DS,SEPTRA DS) 800-160 MG tablet Take 1 tablet 2 (two) times daily by mouth. 06/18/17   Tommi RumpsSummers, Rhonda L, PA-C  valsartan-hydrochlorothiazide (DIOVAN-HCT) 160-25 MG tablet Take 1 tablet by mouth daily. 07/28/15   [provider]    Allergies Patient has no known allergies.  Family History  Problem Relation Age of Onset  . CAD Father   . CVA Mother     Social History Social History   Tobacco Use  . Smoking status: Never Smoker  . Smokeless tobacco: Never Used  Substance Use Topics  . Alcohol use:  No    Alcohol/week: 0.0 standard drinks  . Drug use: No    Review of Systems Constitutional: No fever/chills Eyes: No visual changes. ENT: Positive for tooth ache. Cardiovascular: Denies chest pain. Respiratory: Denies shortness of breath. Gastrointestinal: No abdominal pain.  No nausea, no vomiting.  No diarrhea.   Genitourinary: Negative for dysuria. Musculoskeletal: Negative for back pain. Skin: Negative for rash. Neurological: Negative for headaches, focal weakness or  numbness.  ____________________________________________   PHYSICAL EXAM:  VITAL SIGNS: ED Triage Vitals  Enc Vitals Group     BP 12/23/18 0921 (!) 200/75     Pulse Rate 12/23/18 0921 82     Resp 12/23/18 0921 18     Temp 12/23/18 0921 98.7 F (37.1 C)     Temp Source 12/23/18 0921 Oral     SpO2 12/23/18 0921 100 %     Weight 12/23/18 0919 229 lb (103.9 kg)     Height 12/23/18 0919 5\' 4"  (1.626 m)     Head Circumference --      Peak Flow --      Pain Score 12/23/18 0919 10   Constitutional: Alert and oriented.  Eyes: Conjunctivae are normal.  ENT      Head: Normocephalic and atraumatic.      Nose: No congestion/rhinnorhea.      Mouth/Throat: Mucous membranes are moist.      Neck: No stridor. Hematological/Lymphatic/Immunilogical: No cervical lymphadenopathy. Cardiovascular: Normal rate, regular rhythm.  No murmurs, rubs, or gallops.  Respiratory: Normal respiratory effort without tachypnea nor retractions. Breath sounds are clear and equal bilaterally. No wheezes/rales/rhonchi. Gastrointestinal: Soft and non tender. No rebound. No guarding.  Genitourinary: Deferred Musculoskeletal: Normal range of motion in all extremities. No lower extremity edema. Neurologic:  Normal speech and language. No gross focal neurologic deficits are appreciated.  Skin:  Skin is warm, dry and intact. No rash noted. Psychiatric: Mood and affect are normal. Speech and behavior are normal. P2atient exhibits appropriate insight and judgment.  ____________________________________________    LABS (pertinent positives/negatives)  CBC wbc 8.5, hgb 14.0, plt 255 BMP wnl except na 133, glu 238, ca 8.8  ____________________________________________   EKG  None  ____________________________________________    RADIOLOGY  None  ____________________________________________   PROCEDURES  Procedures  ____________________________________________   INITIAL IMPRESSION / ASSESSMENT AND PLAN  / ED COURSE  Pertinent labs & imaging results that were available during my care of the patient were reviewed by me and considered in my medical decision making (see chart for details).   Patient presented to the emergency department today because of concerns for high blood sugar and hypertension.  Patient was initially somewhat hyperglycemic.  Blood work without any anion gap to suggest DKA.  She was given IV fluids and her blood sugar did come down.  Did discuss with patient portance of following up with the primary care for both hyperglycemia high blood pressure. She has follow up appointment with dentistry later today.   ____________________________________________   FINAL CLINICAL IMPRESSION(S) / ED DIAGNOSES  Final diagnoses:  Hyperglycemia     Note: This dictation was prepared with Dragon dictation. Any transcriptional errors that result from this process are unintentional     Phineas Semen, MD 12/23/18 1159

## 2018-12-23 NOTE — ED Notes (Signed)
Patient states she has infected tooth and has been trying to get it pulled since December. Patient reports that every time she has an appointment to get tooth pulled, her blood pressure and blood sugar are too high for procedure to be done. Patient states she has been compliant with BP medication as well as insulin. Patient states she was started on fast acting insulin as well in an order to better control her sugar.

## 2018-12-24 ENCOUNTER — Other Ambulatory Visit: Payer: Self-pay

## 2018-12-24 ENCOUNTER — Emergency Department
Admission: EM | Admit: 2018-12-24 | Discharge: 2018-12-24 | Disposition: A | Discharge status: Home / Self Care | Payer: Medicare Other | Attending: Emergency Medicine | Admitting: Emergency Medicine

## 2018-12-24 ENCOUNTER — Encounter: Payer: Self-pay | Admitting: Emergency Medicine

## 2018-12-24 DIAGNOSIS — Z03818 Encounter for observation for suspected exposure to other biological agents ruled out: Secondary | ICD-10-CM | POA: Diagnosis not present

## 2018-12-24 DIAGNOSIS — I10 Essential (primary) hypertension: Secondary | ICD-10-CM | POA: Diagnosis not present

## 2018-12-24 DIAGNOSIS — Z794 Long term (current) use of insulin: Secondary | ICD-10-CM | POA: Insufficient documentation

## 2018-12-24 DIAGNOSIS — Z79899 Other long term (current) drug therapy: Secondary | ICD-10-CM | POA: Diagnosis not present

## 2018-12-24 DIAGNOSIS — R739 Hyperglycemia, unspecified: Secondary | ICD-10-CM

## 2018-12-24 DIAGNOSIS — E1165 Type 2 diabetes mellitus with hyperglycemia: Secondary | ICD-10-CM | POA: Insufficient documentation

## 2018-12-24 DIAGNOSIS — J45909 Unspecified asthma, uncomplicated: Secondary | ICD-10-CM | POA: Diagnosis not present

## 2018-12-24 LAB — GLUCOSE, CAPILLARY: Glucose-Capillary: 283 mg/dL — ABNORMAL HIGH (ref 70–99)

## 2018-12-24 MED ORDER — CLONIDINE HCL 0.1 MG PO TABS: 0.1000 mg | ORAL_TABLET | Freq: Every day | ORAL | 0 refills | Status: DC | PRN

## 2018-12-24 MED ORDER — CLONIDINE HCL 0.1 MG PO TABS
0.1000 mg | ORAL_TABLET | Freq: Once | ORAL | Status: DC
  Filled 2018-12-24: qty 1

## 2018-12-24 NOTE — ED Provider Notes (Signed)
[facility] Emergency Department Provider Note  Time seen: 9:29 AM  I have reviewed the triage vital signs and the nursing notes.   HISTORY  Chief Complaint Hyperglycemia and Hypertension    HPI Mary Hodges is a 78 y.o. female with a past medical history of arthritis, asthma, gastric reflux, hypertension, hyperlipidemia, diabetes, presents emergency department for hypertension hyperglycemia.  According to the patient she was seen here yesterday for elevated blood sugar and blood pressure.  Patient had a reassuring work-up and was ultimately discharged home.  Patient called her primary care doctor this morning states her blood glucose was 416 and her blood pressure was over 200, he recommended she come to the emergency department.  Patient states she took her morning insulin prior to leaving home and upon arrival her blood glucose is 280.  Patient's blood pressure she states was over 200 this morning and is currently 180/77.  Patient states she took her morning blood pressure medications prior to leaving her home.   Patient denies any chest pain, dizziness, shortness of breath cough congestion or fever.  Past Medical History:  Diagnosis Date  . Arthritis   . Asthma   . GERD (gastroesophageal reflux disease)   . HLD (hyperlipidemia)   . HTN (hypertension)   . Hypertension   . Type 2 diabetes mellitus [facility])     Patient Active Problem List   Diagnosis Date Noted  . Syncope 09/06/2015  . HTN (hypertension) 09/06/2015  . HLD (hyperlipidemia) 09/06/2015  . Type 2 diabetes mellitus (HCC) 09/06/2015  . GERD (gastroesophageal reflux disease) 09/06/2015    Past Surgical History:  Procedure Laterality Date  . left toe surgery      Prior to Admission medications   Medication Sig Start Date End Date Taking? Authorizing Provider  albuterol (PROAIR HFA) 108 (90 Base) MCG/ACT inhaler Inhale 2 puffs into the lungs every 6 (six) hours as needed. 01/30/15 01/30/16   [provider]  amLODipine (NORVASC) 10 MG tablet Take 10 mg by mouth daily. 07/28/15   [provider]  aspirin EC 81 MG tablet Take 1 tablet by mouth daily.    [provider]  esomeprazole (NEXIUM) 40 MG capsule Take 1 capsule by mouth daily.    [provider]  glipiZIDE (GLUCOTROL XL) 10 MG 24 hr tablet Take 1 tablet by mouth daily. 07/28/15   [provider]  HYDROcodone-acetaminophen (NORCO/VICODIN) 5-325 MG tablet Take 1 tablet every 6 (six) hours as needed by mouth for moderate pain. 06/18/17   Tommi Rumps, PA-C  LEVEMIR FLEXTOUCH 100 UNIT/ML Pen Inject 60 Units into the skin at bedtime. 06/29/15   [provider]  lovastatin (MEVACOR) 20 MG tablet Take 1 tablet by mouth daily. 07/28/15   [provider]  metFORMIN (GLUCOPHAGE-XR) 500 MG 24 hr tablet Take 1 tablet by mouth daily. 07/28/15   [provider]  sitaGLIPtin (JANUVIA) 100 MG tablet Take 1 tablet by mouth daily. 01/30/15   [provider]  sulfamethoxazole-trimethoprim (BACTRIM DS,SEPTRA DS) 800-160 MG tablet Take 1 tablet 2 (two) times daily by mouth. 06/18/17   Tommi Rumps, PA-C  valsartan-hydrochlorothiazide (DIOVAN-HCT) 160-25 MG tablet Take 1 tablet by mouth daily. 07/28/15   [provider]    No Known Allergies  Family History  Problem Relation Age of Onset  . CAD Father   . CVA Mother     Social History Social History   Tobacco Use  . Smoking status: Never Smoker  .  Smokeless tobacco: Never Used  Substance Use Topics  . Alcohol use: No    Alcohol/week: 0.0 standard drinks  . Drug use: No    Review of Systems Constitutional: Negative for fever Cardiovascular: Negative for chest pain. Respiratory: Negative for shortness of breath.  Negative for cough. Gastrointestinal: Negative for abdominal pain, vomiting Musculoskeletal: Negative for musculoskeletal complaints Skin: Negative for skin complaints   Neurological: Negative for headache All other ROS negative  ____________________________________________   PHYSICAL EXAM:  VITAL SIGNS: ED Triage Vitals  Enc Vitals Group     BP 12/24/18 0903 (!) 180/77     Pulse Rate 12/24/18 0903 77     Resp 12/24/18 0903 18     Temp 12/24/18 0903 97.8 F (36.6 C)     Temp Source 12/24/18 0903 Oral     SpO2 12/24/18 0903 100 %     Weight --      Height --      Head Circumference --      Peak Flow --      Pain Score 12/24/18 0901 0     Pain Loc --      Pain Edu? --      Excl. in GC? --    Constitutional: Alert and oriented. Well appearing and in no distress. Eyes: Normal exam ENT      Head: Normocephalic and atraumatic      Mouth/Throat: Mucous membranes are moist. Cardiovascular: Normal rate, regular rhythm.  Respiratory: Normal respiratory effort without tachypnea nor retractions. Breath sounds are clear Gastrointestinal: Soft and nontender. No distention.   Musculoskeletal: Nontender with normal range of motion in all extremities.  Neurologic:  Normal speech and language. No gross focal neurologic deficits  Skin:  Skin is warm, dry and intact.  Psychiatric: Mood and affect are normal.   ____________________________________________   INITIAL IMPRESSION / ASSESSMENT AND PLAN / ED COURSE  Pertinent labs & imaging results that were available during my care of the patient were reviewed by me and considered in my medical decision making (see chart for details).   Patient presents to the emergency department for hyperglycemia and hypertension.  Patient's blood glucose currently 280.  I reviewed the patient's lab work from yesterday which is reassuring.  I had a discussion with the patient regarding her blood glucose and high blood pressure.  Patient is on a good diabetic and hypertension regimen.  I discussed adding a as needed medication such as clonidine 0.1 mg once daily as needed for blood pressure greater than 180.  Patient  agreeable to plan of care and will follow-up with her doctor.  Currently patient is asymptomatic and well-appearing.  With normal lab work performed yesterday and a reassuring physical exam I do not see the need for repeat lab work at this time.  Patient agreeable.  Mary Hodges was evaluated in Emergency Department on 12/24/2018 for the symptoms described in the history of present illness. She was evaluated in the context of the global COVID-19 pandemic, which necessitated consideration that the patient might be at risk for infection with the SARS-CoV-2 virus that causes COVID-19. Institutional protocols and algorithms that pertain to the evaluation of patients at risk for COVID-19 are in a state of rapid change based on information released by regulatory bodies including the CDC and federal and state organizations. These policies and algorithms were followed during the patient's care in the ED.  ____________________________________________   FINAL CLINICAL IMPRESSION(S) / ED DIAGNOSES  Hypertension Hyperglycemia    Lanie Schelling,  Caryn Bee, MD 12/24/18 [phone]

## 2018-12-24 NOTE — ED Notes (Signed)
Pt presents to ED via POV with c/o continued hypertension and hyperglycemia. Pt states took home meds PTA. States at home CBG 413, took her levemir and 15 units fast acting insulin. Pt is alert and oriented at this time. Denies dizziness, denies pain at this time. CBG upon arrival 283. MD aware.

## 2018-12-24 NOTE — ED Triage Notes (Signed)
Says she was here yesterday. Called her doctor today and he sent here here for hight blood sugar and high bp

## 2018-12-24 NOTE — ED Notes (Signed)
NAD noted at time of D/C. Pt denies questions or concerns. Pt ambulatory to the lobby at this time. Pt refused wheelchair to the lobby.  

## 2018-12-29 ENCOUNTER — Other Ambulatory Visit: Payer: Self-pay | Admitting: *Deleted

## 2018-12-29 NOTE — Patient Outreach (Signed)
[facility]) Care Management  12/29/2018  Mary Hodges 1940/09/05 [id number]   Opened in error  Cala Bradford L. Noelle Penner, RN, BSN, CCM [facility] Telephonic Care Management Care Coordinator Office number [phone] Mobile number [phone]  Main [facility] number [phone] Fax number [phone]

## 2018-12-30 ENCOUNTER — Other Ambulatory Visit: Payer: Self-pay | Admitting: *Deleted

## 2018-12-30 NOTE — Patient Outreach (Addendum)
[facility]) Care Management  12/30/2018  Mary Hodges 12/25/1940 [id number]   United health care medicare UM referral Telephone Screen  Referral Date: 12/27/18 Referral Source: .UM referral Referral Reason:[employer]HC phone [phone] or [phone] Member was seen at [facility] regional 5/14 for hyperglycemia (sent by her dentist) She was seen at [facility] 5/15 for hypertension and hyperglycemia (sent by her pcp) She has an infected tooth that needs to be extracted but unable to until her blood sugar is down.  A Bellamy 12/24/18  Insurance: united health care medicare     Outreach attempt # 1 successful to the [phone] number after a few unsuccessful attempts to this number and the [phone] number  Patient is able to verify partial HIPAA before she disconnected the call stating she "got it under control now" Reviewed and addressed referral to [facility] with patient briefly  She reports her cbg is now under control and "I needed to get that tooth out." She reports "I am out and not at home. I can't talk." When CM asked if a return call is needed later and if she still needed assistance from [facility] staff, CM was informed by the patient that she did not need any assistance and "I got it under control. Bye"   Social: Mary Hodges is a widow patient who denies needs for her care needs nor transportation assist to medical appointments, She is listed with a daughter    Conditions:  Diabetes type 2, infected tooth, HTN, syncope, GERD, hx of abscess of right breast, HLD  DME: cbg meter  Medications: She denies concerns with taking medications as prescribed, affording medications, side effects of medications and questions about medications   Advance Directives: Denies need for assist with advance directives    Consent: [facility] RN CM reviewed [facility] services with patient. Patient gave verbal consent for services [facility] telephonic RN CM.   Plan: [facility] RN CM will close case at this time  as patient has been assessed and  She reports her needs have been resolved, eligible but did not want to participate   Pt encouraged to return a call to [facility] RN CM prn  [facility] RN CM sent a successful outreach letter as discussed with [facility] brochure enclosed for review  Routed note to MD  Mary Bradford L. Noelle Penner, RN, BSN, CCM [facility] Telephonic Care Management Care Coordinator Office number [phone] Mobile number [phone]  Main [facility] number [phone] Fax number [phone]

## 2019-01-04 DIAGNOSIS — E11610 Type 2 diabetes mellitus with diabetic neuropathic arthropathy: Secondary | ICD-10-CM | POA: Diagnosis not present

## 2019-01-04 DIAGNOSIS — I119 Hypertensive heart disease without heart failure: Secondary | ICD-10-CM | POA: Diagnosis not present

## 2019-01-04 DIAGNOSIS — E781 Pure hyperglyceridemia: Secondary | ICD-10-CM | POA: Diagnosis not present

## 2019-01-04 DIAGNOSIS — E119 Type 2 diabetes mellitus without complications: Secondary | ICD-10-CM | POA: Diagnosis not present

## 2019-01-25 DIAGNOSIS — E113393 Type 2 diabetes mellitus with moderate nonproliferative diabetic retinopathy without macular edema, bilateral: Secondary | ICD-10-CM | POA: Diagnosis not present

## 2019-02-14 DIAGNOSIS — I119 Hypertensive heart disease without heart failure: Secondary | ICD-10-CM | POA: Diagnosis not present

## 2019-02-14 DIAGNOSIS — E119 Type 2 diabetes mellitus without complications: Secondary | ICD-10-CM | POA: Diagnosis not present

## 2019-02-14 DIAGNOSIS — J219 Acute bronchiolitis, unspecified: Secondary | ICD-10-CM | POA: Diagnosis not present

## 2019-02-14 DIAGNOSIS — E781 Pure hyperglyceridemia: Secondary | ICD-10-CM | POA: Diagnosis not present

## 2019-03-07 DIAGNOSIS — E11610 Type 2 diabetes mellitus with diabetic neuropathic arthropathy: Secondary | ICD-10-CM | POA: Diagnosis not present

## 2019-03-07 DIAGNOSIS — E889 Metabolic disorder, unspecified: Secondary | ICD-10-CM | POA: Diagnosis not present

## 2019-03-07 DIAGNOSIS — E781 Pure hyperglyceridemia: Secondary | ICD-10-CM | POA: Diagnosis not present

## 2019-03-07 DIAGNOSIS — E1169 Type 2 diabetes mellitus with other specified complication: Secondary | ICD-10-CM | POA: Diagnosis not present

## 2019-05-25 ENCOUNTER — Other Ambulatory Visit: Payer: Self-pay

## 2019-05-25 NOTE — Patient Outreach (Signed)
[facility]) Care Management  05/25/2019  Mary Hodges [birth year] [id number]   Medication Adherence call to Mary Hodges spoke with patient she is past due on Valsartan/Hctz 160/25 mg patient explain she take 1 tablet daily and has enough for 3 more week and will order when due. Mary Hodges is showing past due under [employer].   [facility] Management Direct Dial [phone]  Fax [phone] [email or web address]

## 2019-06-06 DIAGNOSIS — M159 Polyosteoarthritis, unspecified: Secondary | ICD-10-CM | POA: Diagnosis not present

## 2019-06-06 DIAGNOSIS — E119 Type 2 diabetes mellitus without complications: Secondary | ICD-10-CM | POA: Diagnosis not present

## 2019-06-06 DIAGNOSIS — E781 Pure hyperglyceridemia: Secondary | ICD-10-CM | POA: Diagnosis not present

## 2019-06-06 DIAGNOSIS — I119 Hypertensive heart disease without heart failure: Secondary | ICD-10-CM | POA: Diagnosis not present

## 2019-06-15 DIAGNOSIS — E113293 Type 2 diabetes mellitus with mild nonproliferative diabetic retinopathy without macular edema, bilateral: Secondary | ICD-10-CM | POA: Diagnosis not present

## 2019-07-01 ENCOUNTER — Other Ambulatory Visit: Payer: Self-pay

## 2019-07-01 DIAGNOSIS — J45909 Unspecified asthma, uncomplicated: Secondary | ICD-10-CM | POA: Diagnosis not present

## 2019-07-01 DIAGNOSIS — Z7982 Long term (current) use of aspirin: Secondary | ICD-10-CM | POA: Diagnosis not present

## 2019-07-01 DIAGNOSIS — Z79899 Other long term (current) drug therapy: Secondary | ICD-10-CM | POA: Insufficient documentation

## 2019-07-01 DIAGNOSIS — I10 Essential (primary) hypertension: Secondary | ICD-10-CM | POA: Insufficient documentation

## 2019-07-01 DIAGNOSIS — N390 Urinary tract infection, site not specified: Secondary | ICD-10-CM | POA: Insufficient documentation

## 2019-07-01 DIAGNOSIS — Z7984 Long term (current) use of oral hypoglycemic drugs: Secondary | ICD-10-CM | POA: Diagnosis not present

## 2019-07-01 DIAGNOSIS — E119 Type 2 diabetes mellitus without complications: Secondary | ICD-10-CM | POA: Diagnosis not present

## 2019-07-01 DIAGNOSIS — U071 COVID-19: Secondary | ICD-10-CM | POA: Diagnosis not present

## 2019-07-01 DIAGNOSIS — R531 Weakness: Secondary | ICD-10-CM | POA: Diagnosis present

## 2019-07-01 LAB — BASIC METABOLIC PANEL
Anion gap: 16 — ABNORMAL HIGH (ref 5–15)
BUN: 12 mg/dL (ref 8–23)
CO2: 21 mmol/L — ABNORMAL LOW (ref 22–32)
Calcium: 8.7 mg/dL — ABNORMAL LOW (ref 8.9–10.3)
Chloride: 96 mmol/L — ABNORMAL LOW (ref 98–111)
Creatinine, Ser: 0.58 mg/dL (ref 0.44–1.00)
GFR calc Af Amer: >60 mL/min (ref >60)
GFR calc non Af Amer: >60 mL/min (ref >60)
Glucose, Bld: 298 mg/dL — ABNORMAL HIGH (ref 70–99)
Potassium: 3.5 mmol/L (ref 3.5–5.1)
Sodium: 133 mmol/L — ABNORMAL LOW (ref 135–145)

## 2019-07-01 LAB — CBC
HCT: 42 % (ref 36.0–46.0)
Hemoglobin: 14.1 g/dL (ref 12.0–15.0)
MCH: 26.8 pg (ref 26.0–34.0)
MCHC: 33.6 g/dL (ref 30.0–36.0)
MCV: 79.8 fL — ABNORMAL LOW (ref 80.0–100.0)
Platelets: 186 10*3/uL (ref 150–400)
RBC: 5.26 MIL/uL — ABNORMAL HIGH (ref 3.87–5.11)
RDW: 11.9 % (ref 11.5–15.5)
WBC: 5.6 10*3/uL (ref 4.0–10.5)
nRBC: 0 % (ref 0.0–0.2)

## 2019-07-01 NOTE — ED Triage Notes (Signed)
Patient reports symptoms for approximately a week and a half.  Reports aching all over and non productive cough with generalized weakness.

## 2019-07-02 ENCOUNTER — Emergency Department
Admission: EM | Admit: 2019-07-02 | Discharge: 2019-07-02 | Disposition: A | Discharge status: Home / Self Care | Payer: Medicare Other | Attending: Emergency Medicine | Admitting: Emergency Medicine

## 2019-07-02 ENCOUNTER — Emergency Department: Payer: Medicare Other

## 2019-07-02 DIAGNOSIS — N390 Urinary tract infection, site not specified: Secondary | ICD-10-CM

## 2019-07-02 LAB — URINALYSIS, COMPLETE (UACMP) WITH MICROSCOPIC
Bilirubin Urine: NEGATIVE
Glucose, UA: >=500 mg/dL — AB
Ketones, ur: 20 mg/dL — AB
Nitrite: NEGATIVE
Protein, ur: 100 mg/dL — AB
RBC / HPF: >50 RBC/hpf — ABNORMAL HIGH (ref 0–5)
Specific Gravity, Urine: 1.017 (ref 1.005–1.030)
WBC, UA: >50 WBC/hpf — ABNORMAL HIGH (ref 0–5)
pH: 5 (ref 5.0–8.0)

## 2019-07-02 LAB — SARS CORONAVIRUS 2 (TAT 6-24 HRS): SARS Coronavirus 2: POSITIVE — AB

## 2019-07-02 MED ORDER — CEPHALEXIN 500 MG PO CAPS: 500.0000 mg | ORAL_CAPSULE | Freq: Four times a day (QID) | ORAL | 0 refills | Status: AC

## 2019-07-02 MED ORDER — SODIUM CHLORIDE 0.9 % IV SOLN
1.0000 g | Freq: Once | INTRAVENOUS | Status: AC
  Administered 2019-07-02: 1 g via INTRAVENOUS
  Filled 2019-07-02: qty 10

## 2019-07-02 MED ORDER — SODIUM CHLORIDE 0.9 % IV BOLUS
500.0000 mL | Freq: Once | INTRAVENOUS | Status: AC
  Administered 2019-07-02: 500 mL via INTRAVENOUS

## 2019-07-02 NOTE — Discharge Instructions (Addendum)
Please seek medical attention for any high fevers, chest pain, shortness of breath, change in behavior, persistent vomiting, bloody stool or any other new or concerning symptoms.  

## 2019-07-02 NOTE — ED Notes (Signed)
Patient resting quietly with eyes closed in no acute distress.  

## 2019-07-02 NOTE — ED Notes (Signed)
Patient yelling across lobby without difficulty in complete sentences that she is having breathing difficulty.  Vital signs rechecked.

## 2019-07-02 NOTE — ED Provider Notes (Signed)
[facility] Emergency Department Provider Note   ____________________________________________   I have reviewed the triage vital signs and the nursing notes.   HISTORY  Chief Complaint Weakness and Generalized Body Aches   History limited by: Not Limited   HPI Mary Hodges is a 78 y.o. female who presents to the emergency department today because of concern for body aches and cough. Her symptoms started roughly 1 week ago. Has been persistent since then. The cough has been none productive. It has been accompanied by some slight chest tightness. The patient has had diffuse body aches. She denies any fevers. Denies any known sick contacts. Came in today because of worsening symptoms.  Records reviewed. Per medical record review patient has a history of HTN, HLD.   Past Medical History:  Diagnosis Date  . Arthritis   . Asthma   . GERD (gastroesophageal reflux disease)   . HLD (hyperlipidemia)   . HTN (hypertension)   . Hypertension   . Type 2 diabetes mellitus [facility])     Patient Active Problem List   Diagnosis Date Noted  . Syncope 09/06/2015  . HTN (hypertension) 09/06/2015  . HLD (hyperlipidemia) 09/06/2015  . Type 2 diabetes mellitus ([place]) 09/06/2015  . GERD (gastroesophageal reflux disease) 09/06/2015  . Abscess of right breast 08/09/2014  . Benign essential hypertension 02/06/2014    Past Surgical History:  Procedure Laterality Date  . left toe surgery      Prior to Admission medications   Medication Sig Start Date End Date Taking? Authorizing Provider  albuterol (PROAIR HFA) 108 (90 Base) MCG/ACT inhaler Inhale 2 puffs into the lungs every 6 (six) hours as needed. 01/30/15 01/30/16  [provider]  amLODipine (NORVASC) 10 MG tablet Take 10 mg by mouth daily. 07/28/15   [provider]  aspirin EC 81 MG tablet Take 1 tablet by mouth daily.    [provider]  cloNIDine (CATAPRES) 0.1 MG tablet Take 1 tablet (0.1  mg total) by mouth daily as needed (Blood pressure greater than 062 systolic). 12/24/18 12/24/19  Harvest Dark, MD  esomeprazole (NEXIUM) 40 MG capsule Take 1 capsule by mouth daily.    [provider]  glipiZIDE (GLUCOTROL XL) 10 MG 24 hr tablet Take 1 tablet by mouth daily. 07/28/15   [provider]  HYDROcodone-acetaminophen (NORCO/VICODIN) 5-325 MG tablet Take 1 tablet every 6 (six) hours as needed by mouth for moderate pain. 06/18/17   Johnn Hai, PA-C  LEVEMIR FLEXTOUCH 100 UNIT/ML Pen Inject 60 Units into the skin at bedtime. 06/29/15   [provider]  lovastatin (MEVACOR) 20 MG tablet Take 1 tablet by mouth daily. 07/28/15   [provider]  metFORMIN (GLUCOPHAGE-XR) 500 MG 24 hr tablet Take 1 tablet by mouth daily. 07/28/15   [provider]  sitaGLIPtin (JANUVIA) 100 MG tablet Take 1 tablet by mouth daily. 01/30/15   [provider]  sulfamethoxazole-trimethoprim (BACTRIM DS,SEPTRA DS) 800-160 MG tablet Take 1 tablet 2 (two) times daily by mouth. 06/18/17   Johnn Hai, PA-C  valsartan-hydrochlorothiazide (DIOVAN-HCT) 160-25 MG tablet Take 1 tablet by mouth daily. 07/28/15   [provider]    Allergies Patient has no known allergies.  Family History  Problem Relation Age of Onset  . CAD Father   . CVA Mother     Social History Social History   Tobacco Use  . Smoking status: Never Smoker  . Smokeless tobacco: Never Used  Substance Use Topics  .  Alcohol use: No    Alcohol/week: 0.0 standard drinks  . Drug use: No    Review of Systems Constitutional: No fever/chills Eyes: No visual changes. ENT: No sore throat. Cardiovascular: Positive for cough Respiratory: Denies shortness of breath. Gastrointestinal: No abdominal pain.  No nausea, no vomiting.  No diarrhea.   Genitourinary: Negative for dysuria. Musculoskeletal: Positive for body aches Skin: Negative for rash. Neurological: Negative  for headaches, focal weakness or numbness.  ____________________________________________   PHYSICAL EXAM:  VITAL SIGNS: ED Triage Vitals  Enc Vitals Group     BP 07/01/19 2029 (!) 184/65     Pulse Rate 07/01/19 2029 83     Resp 07/01/19 2029 19     Temp 07/01/19 2029 100.1 F (37.8 C)     Temp Source 07/01/19 2029 Oral     SpO2 07/01/19 2029 97 %     Weight 07/01/19 2026 225 lb (102.1 kg)     Height 07/01/19 2026 5' (1.524 m)    Constitutional: Alert and oriented.  Eyes: Conjunctivae are normal.  ENT      Head: Normocephalic and atraumatic.      Nose: No congestion/rhinnorhea.      Mouth/Throat: Mucous membranes are moist.      Neck: No stridor. Hematological/Lymphatic/Immunilogical: No cervical lymphadenopathy. Cardiovascular: Normal rate, regular rhythm.  No murmurs, rubs, or gallops.  Respiratory: Normal respiratory effort without tachypnea nor retractions. Breath sounds are clear and equal bilaterally. No wheezes/rales/rhonchi. Gastrointestinal: Soft and non tender. No rebound. No guarding.  Genitourinary: Deferred Musculoskeletal: Normal range of motion in all extremities. No lower extremity edema. Neurologic:  Normal speech and language. No gross focal neurologic deficits are appreciated.  Skin:  Skin is warm, dry and intact. No rash noted. Psychiatric: Mood and affect are normal. Speech and behavior are normal. Patient exhibits appropriate insight and judgment.  ____________________________________________    LABS (pertinent positives/negatives)  CBC wbc 5.6, hgb 14.1, plt 186 BMP na 133, k 3.5, glu 298 UA cloudy, large leukocytes, >50 rbc and wbc, many baceteria ____________________________________________   EKG  Lurline Idol, attending physician, personally viewed and interpreted this EKG  EKG Time: 2035 Rate: 83 Rhythm: normal sinus rhythm Axis: left axis deviation Intervals: qtc 425 QRS: narrow ST changes: no st elevation Impression:  abnormal ekg  ____________________________________________    RADIOLOGY  CXR No active disease  ____________________________________________   PROCEDURES  Procedures  ____________________________________________   INITIAL IMPRESSION / ASSESSMENT AND PLAN / ED COURSE  Pertinent labs & imaging results that were available during my care of the patient were reviewed by me and considered in my medical decision making (see chart for details).   Patient presents to the emergency department today with concerns for generalized body aches cough and also complained of some weakness.  Patient's work-up was notable with urine concerning for infection.  To get a chest x-ray given concern for cough but this did not show pneumonia.  Covid was sent however results were pending at time of discharge.  Did discuss urinary tract infection with patient.  Patient was given dose of IV antibiotics here in the emergency department.   ____________________________________________   FINAL CLINICAL IMPRESSION(S) / ED DIAGNOSES  Final diagnoses:  Lower urinary tract infectious disease     Note: This dictation was prepared with Dragon dictation. Any transcriptional errors that result from this process are unintentional     Phineas Semen, MD 07/02/19 [phone]

## 2019-07-04 ENCOUNTER — Telehealth: Payer: Self-pay | Admitting: Emergency Medicine

## 2019-07-04 LAB — URINE CULTURE: Culture: >=100000 — AB

## 2019-07-04 NOTE — Telephone Encounter (Signed)
Called patient to inform of positive covid test.  Gave her result. She understands isolation and quarantine guidelines.  She is taking the antibiotic for uti.  She says no fever, but she feels bad.  She agrees to call her pcp and let them know how she  Is doing.

## 2019-07-05 ENCOUNTER — Ambulatory Visit: Admit: 2019-07-05 | Payer: Medicare Other | Admitting: Ophthalmology

## 2019-07-05 SURGERY — PHACOEMULSIFICATION, CATARACT, WITH IOL INSERTION
Anesthesia: Topical | Laterality: Left

## 2019-07-05 NOTE — Progress Notes (Signed)
Brief Pharmacy Note  Patient is a 78 y/o F who presented to [facility] ED 11/20 c/o generalized body aches, weakness, and cough x 1 week. Per chart, UA concerning for UTI and patient was discharged on cephalexin for empiric treatment. COVID-19 test resulted positive after discharge from ED. Urine culture resulted >100k colonies/mL E coli (cefazolin sensitive). Patient appropriately covered on discharge antibiotic. No further intervention indicated.   [place] Resident 05 July 2019

## 2019-09-01 DIAGNOSIS — I119 Hypertensive heart disease without heart failure: Secondary | ICD-10-CM | POA: Diagnosis not present

## 2019-09-01 DIAGNOSIS — E119 Type 2 diabetes mellitus without complications: Secondary | ICD-10-CM | POA: Diagnosis not present

## 2019-09-01 DIAGNOSIS — E781 Pure hyperglyceridemia: Secondary | ICD-10-CM | POA: Diagnosis not present

## 2019-09-01 DIAGNOSIS — M159 Polyosteoarthritis, unspecified: Secondary | ICD-10-CM | POA: Diagnosis not present

## 2019-09-02 DIAGNOSIS — I10 Essential (primary) hypertension: Secondary | ICD-10-CM | POA: Diagnosis not present

## 2019-09-02 DIAGNOSIS — R5381 Other malaise: Secondary | ICD-10-CM | POA: Diagnosis not present

## 2019-10-10 DIAGNOSIS — E1169 Type 2 diabetes mellitus with other specified complication: Secondary | ICD-10-CM | POA: Diagnosis not present

## 2019-10-10 DIAGNOSIS — M1990 Unspecified osteoarthritis, unspecified site: Secondary | ICD-10-CM | POA: Diagnosis not present

## 2019-10-10 DIAGNOSIS — E889 Metabolic disorder, unspecified: Secondary | ICD-10-CM | POA: Diagnosis not present

## 2019-10-10 DIAGNOSIS — E11610 Type 2 diabetes mellitus with diabetic neuropathic arthropathy: Secondary | ICD-10-CM | POA: Diagnosis not present

## 2019-12-12 ENCOUNTER — Ambulatory Visit: Payer: Medicare Other | Admitting: Internal Medicine

## 2019-12-19 ENCOUNTER — Other Ambulatory Visit: Payer: Self-pay | Admitting: Internal Medicine

## 2019-12-19 NOTE — Telephone Encounter (Signed)
Patient needs an appointment for her Tramadol refill. Attempted to contact pt, no answer unable to leave vm.

## 2019-12-22 ENCOUNTER — Other Ambulatory Visit: Payer: Self-pay | Admitting: Internal Medicine

## 2019-12-23 DIAGNOSIS — H2513 Age-related nuclear cataract, bilateral: Secondary | ICD-10-CM | POA: Diagnosis not present

## 2020-01-02 DIAGNOSIS — E1159 Type 2 diabetes mellitus with other circulatory complications: Secondary | ICD-10-CM | POA: Diagnosis not present

## 2020-01-02 DIAGNOSIS — H2511 Age-related nuclear cataract, right eye: Secondary | ICD-10-CM | POA: Diagnosis not present

## 2020-01-03 ENCOUNTER — Encounter: Payer: Self-pay | Admitting: Internal Medicine

## 2020-01-03 ENCOUNTER — Ambulatory Visit (INDEPENDENT_AMBULATORY_CARE_PROVIDER_SITE_OTHER): Payer: Medicare Other | Admitting: Internal Medicine

## 2020-01-03 ENCOUNTER — Other Ambulatory Visit: Payer: Self-pay

## 2020-01-03 VITALS — BP 137/75 | HR 64 | Wt 220.2 lb

## 2020-01-03 DIAGNOSIS — E1169 Type 2 diabetes mellitus with other specified complication: Secondary | ICD-10-CM

## 2020-01-03 DIAGNOSIS — I10 Essential (primary) hypertension: Secondary | ICD-10-CM

## 2020-01-03 DIAGNOSIS — K219 Gastro-esophageal reflux disease without esophagitis: Secondary | ICD-10-CM

## 2020-01-03 DIAGNOSIS — M1712 Unilateral primary osteoarthritis, left knee: Secondary | ICD-10-CM

## 2020-01-03 LAB — GLUCOSE, POCT (MANUAL RESULT ENTRY): POC Glucose: 139 mg/dl — AB (ref 70–99)

## 2020-01-03 MED ORDER — TRAMADOL HCL 50 MG PO TABS: 50.0000 mg | ORAL_TABLET | Freq: Three times a day (TID) | ORAL | 0 refills | Status: AC | PRN

## 2020-01-03 NOTE — Addendum Note (Signed)
Addended by: Jobie Quaker on: 01/03/2020 11:42 AM   Modules accepted: Orders

## 2020-01-03 NOTE — Assessment & Plan Note (Signed)
Chronic problem. 

## 2020-01-03 NOTE — Assessment & Plan Note (Signed)
under control.

## 2020-01-03 NOTE — Assessment & Plan Note (Signed)
1.  Stable

## 2020-01-03 NOTE — Progress Notes (Signed)
Established Patient Office Visit  Subjective:  Patient ID: Mary Hodges, female    DOB: Oct 22, 1940  Age: 79 y.o. MRN: [id number]  CC:  Chief Complaint  Patient presents with  . Leg Pain    Pt complains of chronic pain in both legs but more pain in the left than the right.    HPI  Mary Hodges presents for valuation of arthritis.  She is also known to have hypertension and elevated blood sugar due to diabetes.  She has a past history of Covid which has resolved.  Blood sugar running in 130s.Her other problems include history of gastroesophageal reflux disease.  Past Medical History:  Diagnosis Date  . Arthritis   . Asthma   . GERD (gastroesophageal reflux disease)   . HLD (hyperlipidemia)   . HTN (hypertension)   . Hypertension   . Type 2 diabetes mellitus (HCC)     Past Surgical History:  Procedure Laterality Date  . left toe surgery      Family History  Problem Relation Age of Onset  . CAD Father   . CVA Mother     Social History   Socioeconomic History  . Marital status: Widowed    Spouse name: Not on file  . Number of children: Not on file  . Years of education: Not on file  . Highest education level: Not on file  Occupational History  . Not on file  Tobacco Use  . Smoking status: Never Smoker  . Smokeless tobacco: Never Used  Substance and Sexual Activity  . Alcohol use: No    Alcohol/week: 0.0 standard drinks  . Drug use: No  . Sexual activity: Not on file  Other Topics Concern  . Not on file  Social History Narrative  . Not on file   Social Determinants of Health   Financial Resource Strain:   . Difficulty of Paying Living Expenses:   Food Insecurity:   . Worried About Programme researcher, broadcasting/film/video in the Last Year:   . Barista in the Last Year:   Transportation Needs:   . Freight forwarder (Medical):   Marland Kitchen Lack of Transportation (Non-Medical):   Physical Activity:   . Days of Exercise per Week:   . Minutes of Exercise per Session:    Stress:   . Feeling of Stress :   Social Connections:   . Frequency of Communication with Friends and Family:   . Frequency of Social Gatherings with Friends and Family:   . Attends Religious Services:   . Active Member of Clubs or Organizations:   . Attends Banker Meetings:   Marland Kitchen Marital Status:   Intimate Partner Violence:   . Fear of Current or Ex-Partner:   . Emotionally Abused:   Marland Kitchen Physically Abused:   . Sexually Abused:      Current Outpatient Medications:  .  amLODipine (NORVASC) 10 MG tablet, Take 10 mg by mouth daily., Disp: , Rfl: 5 .  aspirin EC 81 MG tablet, Take 1 tablet by mouth daily., Disp: , Rfl:  .  esomeprazole (NEXIUM) 40 MG capsule, Take 1 capsule by mouth daily., Disp: , Rfl:  .  glipiZIDE (GLUCOTROL XL) 10 MG 24 hr tablet, Take 1 tablet by mouth daily., Disp: , Rfl: 5 .  LEVEMIR FLEXTOUCH 100 UNIT/ML Pen, Inject 60 Units into the skin at bedtime., Disp: , Rfl: 5 .  lovastatin (MEVACOR) 20 MG tablet, Take 1 tablet by mouth daily., Disp: ,  Rfl: 5 .  metFORMIN (GLUCOPHAGE-XR) 500 MG 24 hr tablet, Take 1 tablet by mouth daily., Disp: , Rfl: 11 .  oxybutynin (DITROPAN) 5 MG tablet, Take 5 mg by mouth daily., Disp: , Rfl:  .  promethazine (PHENERGAN) 25 MG tablet, Take 25 mg by mouth 2 (two) times daily., Disp: , Rfl:  .  traMADol (ULTRAM) 50 MG tablet, Take 50 mg by mouth 2 (two) times daily., Disp: , Rfl:  .  valsartan-hydrochlorothiazide (DIOVAN-HCT) 160-25 MG tablet, Take 1 tablet by mouth daily., Disp: , Rfl: 5 .  albuterol (PROAIR HFA) 108 (90 Base) MCG/ACT inhaler, Inhale 2 puffs into the lungs every 6 (six) hours as needed., Disp: , Rfl:  .  cloNIDine (CATAPRES) 0.1 MG tablet, Take 1 tablet (0.1 mg total) by mouth daily as needed (Blood pressure greater than 448 systolic)., Disp: 30 tablet, Rfl: 0 .  traMADol (ULTRAM) 50 MG tablet, Take 1 tablet (50 mg total) by mouth every 8 (eight) hours as needed for up to 5 days., Disp: 15 tablet, Rfl: 0   No  Known Allergies  ROS Review of Systems  HENT: Negative.   Eyes:       Catract Both eyes  Respiratory: Negative.   Cardiovascular: Negative.   Gastrointestinal: Negative.   Genitourinary: Negative.   Neurological: Negative for dizziness.  Psychiatric/Behavioral: Negative for behavioral problems.      Objective:    Physical Exam  Constitutional: She is oriented to person, place, and time. She appears well-developed.  HENT:  Head: Normocephalic and atraumatic.  Eyes: Pupils are equal, round, and reactive to light.  Neck: No JVD present. No tracheal deviation present. No thyromegaly present.  Cardiovascular:  No murmur heard. Pulmonary/Chest: No respiratory distress.  Abdominal: There is abdominal tenderness. There is no guarding.  Musculoskeletal:        General: No edema.     Cervical back: Normal range of motion.  Lymphadenopathy:    She has no cervical adenopathy.  Neurological: She is alert and oriented to person, place, and time.  Skin: No rash noted.    BP 137/75   Pulse 64   Wt 220 lb 3.2 oz (99.9 kg)   BMI 43.00 kg/m  Wt Readings from Last 3 Encounters:  01/03/20 220 lb 3.2 oz (99.9 kg)  07/01/19 225 lb (102.1 kg)  12/23/18 229 lb (103.9 kg)     Health Maintenance Due  Topic Date Due  . FOOT EXAM  Never done  . OPHTHALMOLOGY EXAM  Never done  . COVID-19 Vaccine (1) Never done  . TETANUS/TDAP  Never done  . DEXA SCAN  Never done  . PNA vac Low Risk Adult (1 of 2 - PCV13) Never done  . HEMOGLOBIN A1C  02/25/2012    There are no preventive care reminders to display for this patient.  Lab Results  Component Value Date   TSH 2.62 08/26/2011   Lab Results  Component Value Date   WBC 5.6 07/01/2019   HGB 14.1 07/01/2019   HCT 42.0 07/01/2019   MCV 79.8 (L) 07/01/2019   PLT 186 07/01/2019   Lab Results  Component Value Date   NA 133 (L) 07/01/2019   K 3.5 07/01/2019   CO2 21 (L) 07/01/2019   GLUCOSE 298 (H) 07/01/2019   BUN 12 07/01/2019    CREATININE 0.58 07/01/2019   BILITOT 0.3 08/18/2013   ALKPHOS 103 08/18/2013   AST 20 08/18/2013   ALT 18 08/18/2013   PROT 7.4 08/18/2013  ALBUMIN 3.5 08/18/2013   CALCIUM 8.7 (L) 07/01/2019   ANIONGAP 16 (H) 07/01/2019   Lab Results  Component Value Date   CHOL 182 08/27/2011   Lab Results  Component Value Date   HDL 37 (L) 08/27/2011   Lab Results  Component Value Date   LDLCALC 124 (H) 08/27/2011   Lab Results  Component Value Date   TRIG 105 08/27/2011   No results found for: [facility] Lab Results  Component Value Date   HGBA1C 9.1 (H) 08/28/2011      Assessment & Plan:   Problem List Items Addressed This Visit      Cardiovascular and Mediastinum   HTN (hypertension)    1.  Stable        Digestive   GERD (gastroesophageal reflux disease)     under control.        Endocrine   Type 2 diabetes mellitus (HCC)   Relevant Orders   POCT glucose (manual entry) (Completed)     Musculoskeletal and Integument   Osteoarthritis of left knee - Primary    Chronic problem.      Relevant Medications   traMADol (ULTRAM) 50 MG tablet   traMADol (ULTRAM) 50 MG tablet      Meds ordered this encounter  Medications  . traMADol (ULTRAM) 50 MG tablet    Sig: Take 1 tablet (50 mg total) by mouth every 8 (eight) hours as needed for up to 5 days.    Dispense:  15 tablet    Refill:  0    Follow-up: Return in about 3 months (around 04/04/2020).    Corky Downs, MD

## 2020-01-04 ENCOUNTER — Other Ambulatory Visit: Payer: Self-pay | Admitting: Internal Medicine

## 2020-01-05 ENCOUNTER — Other Ambulatory Visit: Payer: Self-pay | Admitting: *Deleted

## 2020-01-05 MED ORDER — AMLODIPINE BESYLATE 10 MG PO TABS: 10.0000 mg | ORAL_TABLET | Freq: Every day | ORAL | 8 refills | Status: DC

## 2020-01-17 ENCOUNTER — Encounter: Payer: Self-pay | Admitting: Ophthalmology

## 2020-01-17 ENCOUNTER — Other Ambulatory Visit
Admission: RE | Admit: 2020-01-17 | Discharge: 2020-01-17 | Disposition: A | Discharge status: Home / Self Care | Payer: Medicare Other | Source: Ambulatory Visit | Attending: Ophthalmology | Admitting: Ophthalmology

## 2020-01-17 ENCOUNTER — Other Ambulatory Visit: Payer: Self-pay

## 2020-01-17 DIAGNOSIS — Z20822 Contact with and (suspected) exposure to covid-19: Secondary | ICD-10-CM | POA: Insufficient documentation

## 2020-01-17 DIAGNOSIS — Z01812 Encounter for preprocedural laboratory examination: Secondary | ICD-10-CM | POA: Diagnosis not present

## 2020-01-18 LAB — SARS CORONAVIRUS 2 (TAT 6-24 HRS): SARS Coronavirus 2: NEGATIVE

## 2020-01-18 NOTE — Discharge Instructions (Addendum)
Eye Surgery Discharge Instructions  Expect mild scratchy sensation or mild soreness. DO NOT RUB YOUR EYE!  The day of surgery: . Minimal physical activity, but bed rest is not required . No reading, computer work, or close hand work . No bending, lifting, or straining. . May watch TV  For 24 hours: . No driving, legal decisions, or alcoholic beverages . Safety precautions . Eat anything you prefer: It is better to start with liquids, then soup then solid foods. . _____ Eye patch should be worn until postoperative exam tomorrow. . ____ Solar shield eyeglasses should be worn for comfort in the sunlight/patch while sleeping  Resume all regular medications including aspirin or Coumadin if these were discontinued prior to surgery. You may shower, bathe, shave, or wash your hair. Tylenol may be taken for mild discomfort. Follow Dr. Gerome Sam eye drop instruction sheet as reviewed.  Call your doctor if you experience significant pain, nausea, or vomiting, fever > 101 or other signs of infection. [phone] or [phone] Specific instructions:  Follow-up Information    Galen Manila, MD Follow up.   Specialty: Ophthalmology Why: 01/20/20 @ 2:50 pm Contact information: [address] [place] [address] [phone]

## 2020-01-19 ENCOUNTER — Other Ambulatory Visit: Payer: Self-pay

## 2020-01-19 ENCOUNTER — Encounter: Payer: Self-pay | Admitting: Ophthalmology

## 2020-01-19 ENCOUNTER — Ambulatory Visit: Payer: Medicare Other | Admitting: Anesthesiology

## 2020-01-19 ENCOUNTER — Encounter
Admission: RE | Disposition: A | Discharge status: Home / Self Care | Payer: Self-pay | Source: Home / Self Care | Attending: Ophthalmology

## 2020-01-19 ENCOUNTER — Ambulatory Visit
Admission: RE | Admit: 2020-01-19 | Discharge: 2020-01-19 | Disposition: A | Discharge status: Home / Self Care | Payer: Medicare Other | Attending: Ophthalmology | Admitting: Ophthalmology

## 2020-01-19 DIAGNOSIS — I10 Essential (primary) hypertension: Secondary | ICD-10-CM | POA: Insufficient documentation

## 2020-01-19 DIAGNOSIS — Z7982 Long term (current) use of aspirin: Secondary | ICD-10-CM | POA: Diagnosis not present

## 2020-01-19 DIAGNOSIS — K219 Gastro-esophageal reflux disease without esophagitis: Secondary | ICD-10-CM | POA: Insufficient documentation

## 2020-01-19 DIAGNOSIS — Z8616 Personal history of COVID-19: Secondary | ICD-10-CM | POA: Diagnosis not present

## 2020-01-19 DIAGNOSIS — J45909 Unspecified asthma, uncomplicated: Secondary | ICD-10-CM | POA: Insufficient documentation

## 2020-01-19 DIAGNOSIS — M1990 Unspecified osteoarthritis, unspecified site: Secondary | ICD-10-CM | POA: Diagnosis not present

## 2020-01-19 DIAGNOSIS — Z79899 Other long term (current) drug therapy: Secondary | ICD-10-CM | POA: Insufficient documentation

## 2020-01-19 DIAGNOSIS — E119 Type 2 diabetes mellitus without complications: Secondary | ICD-10-CM | POA: Insufficient documentation

## 2020-01-19 DIAGNOSIS — Z794 Long term (current) use of insulin: Secondary | ICD-10-CM | POA: Diagnosis not present

## 2020-01-19 DIAGNOSIS — E7800 Pure hypercholesterolemia, unspecified: Secondary | ICD-10-CM | POA: Insufficient documentation

## 2020-01-19 DIAGNOSIS — H2511 Age-related nuclear cataract, right eye: Secondary | ICD-10-CM | POA: Insufficient documentation

## 2020-01-19 DIAGNOSIS — E1136 Type 2 diabetes mellitus with diabetic cataract: Secondary | ICD-10-CM | POA: Diagnosis not present

## 2020-01-19 HISTORY — PX: CATARACT EXTRACTION W/PHACO: SHX586

## 2020-01-19 HISTORY — DX: Pure hypercholesterolemia, unspecified: E78.00

## 2020-01-19 LAB — GLUCOSE, CAPILLARY
Glucose-Capillary: 399 mg/dL — ABNORMAL HIGH (ref 70–99)
Glucose-Capillary: 409 mg/dL — ABNORMAL HIGH (ref 70–99)
Glucose-Capillary: 376 mg/dL — ABNORMAL HIGH (ref 70–99)
Glucose-Capillary: 361 mg/dL — ABNORMAL HIGH (ref 70–99)

## 2020-01-19 SURGERY — PHACOEMULSIFICATION, CATARACT, WITH IOL INSERTION
Anesthesia: Monitor Anesthesia Care | Site: Eye | Laterality: Right

## 2020-01-19 MED ORDER — CARBACHOL 0.01 % IO SOLN
INTRAOCULAR | Status: DC | PRN
  Administered 2020-01-19: 0.5 mL via INTRAOCULAR

## 2020-01-19 MED ORDER — LIDOCAINE HCL (PF) 4 % IJ SOLN
INTRAOCULAR | Status: DC | PRN
  Administered 2020-01-19: 2 mL via OPHTHALMIC

## 2020-01-19 MED ORDER — EPINEPHRINE PF 1 MG/ML IJ SOLN
INTRAOCULAR | Status: DC | PRN
  Administered 2020-01-19: 200 mL via OPHTHALMIC

## 2020-01-19 MED ORDER — ONDANSETRON HCL 4 MG/2ML IJ SOLN
INTRAMUSCULAR | Status: DC | PRN
  Administered 2020-01-19: 4 mg via INTRAVENOUS

## 2020-01-19 MED ORDER — INSULIN REGULAR HUMAN 100 UNIT/ML IJ SOLN: 15.0000 [IU] | Freq: Once | INTRAMUSCULAR | Status: DC

## 2020-01-19 MED ORDER — NA CHONDROIT SULF-NA HYALURON 40-17 MG/ML IO SOLN
INTRAOCULAR | Status: DC | PRN
  Administered 2020-01-19: 1 mL via INTRAOCULAR

## 2020-01-19 MED ORDER — MOXIFLOXACIN HCL 0.5 % OP SOLN: 1.0000 [drp] | OPHTHALMIC | Status: DC | PRN

## 2020-01-19 MED ORDER — TETRACAINE HCL 0.5 % OP SOLN
OPHTHALMIC | Status: AC
  Administered 2020-01-19: 1 [drp] via OPHTHALMIC
  Filled 2020-01-19: qty 4

## 2020-01-19 MED ORDER — ARMC OPHTHALMIC DILATING DROPS
OPHTHALMIC | Status: AC
  Filled 2020-01-19: qty 0.5

## 2020-01-19 MED ORDER — INSULIN ASPART 100 UNIT/ML ~~LOC~~ SOLN
SUBCUTANEOUS | Status: AC
  Administered 2020-01-19: 15 [IU]
  Filled 2020-01-19: qty 1

## 2020-01-19 MED ORDER — TETRACAINE HCL 0.5 % OP SOLN: 1.0000 [drp] | Freq: Once | OPHTHALMIC | Status: AC

## 2020-01-19 MED ORDER — INSULIN ASPART 100 UNIT/ML ~~LOC~~ SOLN
15.0000 [IU] | Freq: Once | SUBCUTANEOUS | Status: AC
  Administered 2020-01-19: 15 [IU] via SUBCUTANEOUS

## 2020-01-19 MED ORDER — MIDAZOLAM HCL 2 MG/2ML IJ SOLN
INTRAMUSCULAR | Status: DC | PRN
  Administered 2020-01-19 (×4): .5 mg via INTRAVENOUS

## 2020-01-19 MED ORDER — POVIDONE-IODINE 5 % OP SOLN
OPHTHALMIC | Status: DC | PRN
  Administered 2020-01-19: 1 via OPHTHALMIC

## 2020-01-19 MED ORDER — MOXIFLOXACIN HCL 0.5 % OP SOLN
OPHTHALMIC | Status: AC
  Filled 2020-01-19: qty 3

## 2020-01-19 MED ORDER — SODIUM CHLORIDE 0.9 % IV SOLN: INTRAVENOUS | Status: DC

## 2020-01-19 MED ORDER — MOXIFLOXACIN HCL 0.5 % OP SOLN
OPHTHALMIC | Status: DC | PRN
  Administered 2020-01-19: 0.2 mL via OPHTHALMIC

## 2020-01-19 MED ORDER — ARMC OPHTHALMIC DILATING DROPS
1.0000 "application " | OPHTHALMIC | Status: AC
  Administered 2020-01-19 (×3): 1 via OPHTHALMIC

## 2020-01-19 MED ORDER — MIDAZOLAM HCL 2 MG/2ML IJ SOLN
INTRAMUSCULAR | Status: AC
  Filled 2020-01-19: qty 2

## 2020-01-19 MED ORDER — INSULIN ASPART 100 UNIT/ML ~~LOC~~ SOLN
SUBCUTANEOUS | Status: AC
  Filled 2020-01-19: qty 1

## 2020-01-19 MED ORDER — INSULIN ASPART 100 UNIT/ML ~~LOC~~ SOLN: 15.0000 [IU] | Freq: Once | SUBCUTANEOUS | Status: DC

## 2020-01-19 SURGICAL SUPPLY — 16 items
GLOVE BIO SURGEON STRL SZ8 (GLOVE) ×2 IMPLANT
GLOVE BIOGEL M 6.5 STRL (GLOVE) ×2 IMPLANT
GLOVE SURG LX 8.0 MICRO (GLOVE) ×1
GLOVE SURG LX STRL 8.0 MICRO (GLOVE) ×1 IMPLANT
GOWN STRL REUS W/ TWL LRG LVL3 (GOWN DISPOSABLE) ×2 IMPLANT
GOWN STRL REUS W/TWL LRG LVL3 (GOWN DISPOSABLE) ×2
LABEL CATARACT MEDS ST (LABEL) ×2 IMPLANT
LENS IOL DIOP 21.5 (Intraocular Lens) ×2 IMPLANT
LENS IOL TECNIS MONO 21.5 (Intraocular Lens) IMPLANT
PACK CATARACT BRASINGTON LX (MISCELLANEOUS) ×2 IMPLANT
PACK EYE AFTER SURG (MISCELLANEOUS) ×2 IMPLANT
SOL BSS BAG (MISCELLANEOUS) ×2
SOLUTION BSS BAG (MISCELLANEOUS) ×1 IMPLANT
SYR 5ML LL (SYRINGE) ×2 IMPLANT
WATER STERILE IRR 250ML POUR (IV SOLUTION) ×2 IMPLANT
WIPE NON LINTING 3.25X3.25 (MISCELLANEOUS) ×2 IMPLANT

## 2020-01-19 NOTE — Anesthesia Preprocedure Evaluation (Signed)
Anesthesia Evaluation  Patient identified by MRN, date of birth, ID band Patient awake    Reviewed: Allergy & Precautions, H&P , NPO status , Patient's Chart, lab work & pertinent test results  History of Anesthesia Complications Negative for: history of anesthetic complications  Airway Mallampati: III  TM Distance: <3 FB Neck ROM: limited    Dental  (+) Chipped   Pulmonary neg pulmonary ROS, asthma ,    Pulmonary exam normal        Cardiovascular Exercise Tolerance: Good hypertension, Normal cardiovascular exam     Neuro/Psych negative neurological ROS  negative psych ROS   GI/Hepatic Neg liver ROS, GERD  Medicated and Controlled,  Endo/Other  diabetes, Type 2  Renal/GU      Musculoskeletal  (+) Arthritis ,   Abdominal   Peds  Hematology negative hematology ROS (+)   Anesthesia Other Findings Past Medical History: No date: Arthritis No date: Asthma No date: GERD (gastroesophageal reflux disease) No date: HLD (hyperlipidemia) No date: HTN (hypertension) No date: Hypercholesteremia No date: Hypertension No date: Type 2 diabetes mellitus (HCC)  Past Surgical History: No date: left toe surgery  BMI    Body Mass Index: 40.34 kg/m      Reproductive/Obstetrics negative OB ROS                             Anesthesia Physical Anesthesia Plan  ASA: III  Anesthesia Plan: MAC   Post-op Pain Management:    Induction: Intravenous  PONV Risk Score and Plan:   Airway Management Planned: Natural Airway and Nasal Cannula  Additional Equipment:   Intra-op Plan:   Post-operative Plan:   Informed Consent: I have reviewed the patients History and Physical, chart, labs and discussed the procedure including the risks, benefits and alternatives for the proposed anesthesia with the patient or authorized representative who has indicated his/her understanding and acceptance.      Dental Advisory Given  Plan Discussed with: Anesthesiologist, CRNA and Surgeon  Anesthesia Plan Comments: (Patient consented for risks of anesthesia including but not limited to:  - adverse reactions to medications - damage to eyes, teeth, lips or other oral mucosa - nerve damage due to positioning  - sore throat or hoarseness - Damage to heart, brain, nerves, lungs, other parts of body or loss of life  Patient voiced understanding.)        Anesthesia Quick Evaluation

## 2020-01-19 NOTE — Anesthesia Postprocedure Evaluation (Signed)
Anesthesia Post Note  Patient: Assunta A Mally  Procedure(s) Performed: CATARACT EXTRACTION PHACO AND INTRAOCULAR LENS PLACEMENT (IOC) RIGHT DIABETIC (Right Eye)  Patient location during evaluation: PACU Anesthesia Type: MAC Level of consciousness: awake and alert Pain management: pain level controlled Vital Signs Assessment: post-procedure vital signs reviewed and stable Respiratory status: spontaneous breathing, nonlabored ventilation and respiratory function stable Cardiovascular status: blood pressure returned to baseline and stable Postop Assessment: no apparent nausea or vomiting Anesthetic complications: no Comments: Patient took 1/2 of her normal morning dose of insulin yesterday AM and none since then.  She was given insulin pre and post op for her hypoglycemia.  She was instructed to take 1/2 of her normal dose when she goes home today and to continue to trend her blood glucoses.  She voiced understanding.   No complications documented.   Last Vitals:  Vitals:   01/19/20 1013 01/19/20 1015  BP: (!) 162/69   Pulse: 75 80  Resp: 13 18  Temp:    SpO2: 100% 96%    Last Pain:  Vitals:   01/19/20 1015  TempSrc:   PainSc: 0-No pain                 Precious Haws Anadalay Macdonell

## 2020-01-19 NOTE — Progress Notes (Signed)
Son aware his mom needs to take 1/2 a dose of insulin when she gets home

## 2020-01-19 NOTE — H&P (Signed)
All labs reviewed. Abnormal studies sent to patients PCP when indicated.  Previous H&P reviewed, patient examined, there are NO CHANGES.  Mary Hodges Porfilio6/10/20219:28 AM

## 2020-01-19 NOTE — Transfer of Care (Signed)
Immediate Anesthesia Transfer of Care Note  Patient: Mary Hodges  Procedure(s) Performed: CATARACT EXTRACTION PHACO AND INTRAOCULAR LENS PLACEMENT (IOC) RIGHT DIABETIC (Right Eye)  Patient Location: PACU  Anesthesia Type:MAC  Level of Consciousness: awake, alert , oriented and patient cooperative  Airway & Oxygen Therapy: Patient Spontanous Breathing  Post-op Assessment: Report given to RN and Post -op Vital signs reviewed and stable  Post vital signs: Reviewed and stable  Last Vitals:  Vitals Value Taken Time  BP 151/70 01/19/20 0957  Temp    Pulse 74 01/19/20 0959  Resp 17 01/19/20 0959  SpO2 100 % 01/19/20 0959  Vitals shown include unvalidated device data.  Last Pain:  Vitals:   01/19/20 0833  TempSrc: Tympanic  PainSc: 0-No pain         Complications: No complications documented.

## 2020-01-19 NOTE — OR Nursing (Signed)
Pt cbg 409, Dr. Randa Ngo made aware. Verbal orders given for 15U of novolog , see MAR

## 2020-01-19 NOTE — Op Note (Signed)
PREOPERATIVE DIAGNOSIS:  Nuclear sclerotic cataract of the right eye.   POSTOPERATIVE DIAGNOSIS:  H25.11 Cataract   OPERATIVE PROCEDURE: Procedure(s): CATARACT EXTRACTION PHACO AND INTRAOCULAR LENS PLACEMENT (IOC) RIGHT DIABETIC   SURGEON:  Galen Manila, MD.   ANESTHESIA:  Anesthesiologist: Piscitello, Cleda Mccreedy, MD CRNA: Mohammed Kindle, CRNA  1.      Managed anesthesia care. 2.      0.20ml of Shugarcaine was instilled in the eye following the paracentesis.   COMPLICATIONS:  None.   TECHNIQUE:   Stop and chop   DESCRIPTION OF PROCEDURE:  The patient was examined and consented in the preoperative holding area where the aforementioned topical anesthesia was applied to the right eye and then brought back to the Operating Room where the right eye was prepped and draped in the usual sterile ophthalmic fashion and a lid speculum was placed. A paracentesis was created with the side port blade and the anterior chamber was filled with viscoelastic. A near clear corneal incision was performed with the steel keratome. A continuous curvilinear capsulorrhexis was performed with a cystotome followed by the capsulorrhexis forceps. Hydrodissection and hydrodelineation were carried out with BSS on a blunt cannula. The lens was removed in a stop and chop  technique and the remaining cortical material was removed with the irrigation-aspiration handpiece. The capsular bag was inflated with viscoelastic and the Technis [id number]  lens was placed in the capsular bag without complication. The remaining viscoelastic was removed from the eye with the irrigation-aspiration handpiece. The wounds were hydrated. The anterior chamber was flushed with Miostat and the eye was inflated to physiologic pressure. 0.18ml of Vigamox was placed in the anterior chamber. The wounds were found to be water tight. The eye was dressed with Vigamox. The patient was given protective glasses to wear throughout the day and a shield  with which to sleep tonight. The patient was also given drops with which to begin a drop regimen today and will follow-up with me in one day. Implant Name Type Inv. Item Serial No. Manufacturer Lot No. LRB No. Used Action  LENS IOL DIOP 21.5 - [id number] Intraocular Lens LENS IOL DIOP 21.5 [id number]  Right 1 Implanted   Procedure(s) with comments: CATARACT EXTRACTION PHACO AND INTRAOCULAR LENS PLACEMENT (IOC) RIGHT DIABETIC (Right) - US1:18.4 CDE9.08 LOT [id number]  Electronically signed: Galen Manila 01/19/2020 9:57 AM

## 2020-01-19 NOTE — Progress Notes (Signed)
cbg rechecked and 399 , Dr. Randa Ngo aware, no changes to procedure.

## 2020-01-20 ENCOUNTER — Encounter: Payer: Self-pay | Admitting: Ophthalmology

## 2020-02-20 ENCOUNTER — Other Ambulatory Visit: Payer: Self-pay | Admitting: Internal Medicine

## 2020-02-20 ENCOUNTER — Other Ambulatory Visit: Payer: Self-pay | Admitting: *Deleted

## 2020-02-20 DIAGNOSIS — E119 Type 2 diabetes mellitus without complications: Secondary | ICD-10-CM

## 2020-02-22 ENCOUNTER — Other Ambulatory Visit: Payer: Self-pay | Admitting: *Deleted

## 2020-02-22 DIAGNOSIS — E119 Type 2 diabetes mellitus without complications: Secondary | ICD-10-CM

## 2020-02-22 MED ORDER — LEVEMIR FLEXTOUCH 100 UNIT/ML ~~LOC~~ SOPN: 70.0000 [IU] | PEN_INJECTOR | Freq: Every day | SUBCUTANEOUS | 3 refills | Status: DC

## 2020-02-23 ENCOUNTER — Other Ambulatory Visit: Payer: Self-pay

## 2020-02-23 ENCOUNTER — Ambulatory Visit (INDEPENDENT_AMBULATORY_CARE_PROVIDER_SITE_OTHER): Payer: Medicare Other | Admitting: Internal Medicine

## 2020-02-23 ENCOUNTER — Encounter: Payer: Self-pay | Admitting: Internal Medicine

## 2020-02-23 VITALS — BP 127/69 | HR 72 | Ht 68.0 in | Wt 210.9 lb

## 2020-02-23 DIAGNOSIS — E1169 Type 2 diabetes mellitus with other specified complication: Secondary | ICD-10-CM

## 2020-02-23 DIAGNOSIS — K219 Gastro-esophageal reflux disease without esophagitis: Secondary | ICD-10-CM

## 2020-02-23 DIAGNOSIS — I10 Essential (primary) hypertension: Secondary | ICD-10-CM

## 2020-02-23 DIAGNOSIS — M1712 Unilateral primary osteoarthritis, left knee: Secondary | ICD-10-CM

## 2020-02-23 MED ORDER — TRAMADOL HCL 50 MG PO TABS: 50.0000 mg | ORAL_TABLET | Freq: Three times a day (TID) | ORAL | 0 refills | Status: AC | PRN

## 2020-02-23 NOTE — Assessment & Plan Note (Signed)
Blood pressure is 127/69 today.  Her BMI is 32.07.  She was advised to continue taking her .  Losartan 100 mg tablet p.o. daily.  Statin for the diabetes and takes Metformin for the diabetes also.

## 2020-02-23 NOTE — Assessment & Plan Note (Signed)
Patient is overweight.  Also has bilateral osteoarthritis of the knee.  Both hands.  Has trace pedal edema with varicose vein. she is overweight and has been told to lose weight that might help with the arthritic pain.

## 2020-02-23 NOTE — Progress Notes (Addendum)
Patient ID: Mary Hodges, female   DOB: 1941-04-14, 79 y.o.   MRN: [id number]    Established Patient Office Visit  Subjective:  Patient ID: Mary Hodges, female    DOB: Aug 22, 1940  Age: 79 y.o. MRN: [id number]  CC:  Chief Complaint  Patient presents with  . Leg Pain    patinet here for refill of tramadol   . Diabetes    patient checked blood sugar from home and it was 159    HPI  Mary Hodges presents for a refill of her Tramadol, which she takes for arthritis in her legs. It has been bad for the past couple of days due to the rain. She takes Tramadol in the morning and before bed. She is able to sleep well when she takes it. Sometimes, she has pain in her arms and hands. She rates the pain as a 10/10 when it rains, but usually it is a 5/10. Denies back pain, chest pain, and shortness of breath. The patient has never smoked cigarettes.  Past Medical History:  Diagnosis Date  . Arthritis   . Asthma   . GERD (gastroesophageal reflux disease)   . HLD (hyperlipidemia)   . HTN (hypertension)   . Hypercholesteremia   . Hypertension   . Type 2 diabetes mellitus (HCC)     Past Surgical History:  Procedure Laterality Date  . CATARACT EXTRACTION W/PHACO Right 01/19/2020   Procedure: CATARACT EXTRACTION PHACO AND INTRAOCULAR LENS PLACEMENT (IOC) RIGHT DIABETIC;  Surgeon: Galen Manila, MD;  Location: [facility] ORS;  Service: Ophthalmology;  Laterality: Right;  US1:18.4 CDE9.08 LOT [id number]  . left toe surgery      Family History  Problem Relation Age of Onset  . CAD Father   . CVA Mother     Social History   Socioeconomic History  . Marital status: Widowed    Spouse name: Not on file  . Number of children: Not on file  . Years of education: Not on file  . Highest education level: Not on file  Occupational History  . Not on file  Tobacco Use  . Smoking status: Never Smoker  . Smokeless tobacco: Never Used  Vaping Use  . Vaping Use: Never used  Substance and Sexual  Activity  . Alcohol use: No    Alcohol/week: 0.0 standard drinks  . Drug use: No  . Sexual activity: Not on file  Other Topics Concern  . Not on file  Social History Narrative  . Not on file   Social Determinants of Health   Financial Resource Strain:   . Difficulty of Paying Living Expenses:   Food Insecurity:   . Worried About Programme researcher, broadcasting/film/video in the Last Year:   . Barista in the Last Year:   Transportation Needs:   . Freight forwarder (Medical):   Marland Kitchen Lack of Transportation (Non-Medical):   Physical Activity:   . Days of Exercise per Week:   . Minutes of Exercise per Session:   Stress:   . Feeling of Stress :   Social Connections:   . Frequency of Communication with Friends and Family:   . Frequency of Social Gatherings with Friends and Family:   . Attends Religious Services:   . Active Member of Clubs or Organizations:   . Attends Banker Meetings:   Marland Kitchen Marital Status:   Intimate Partner Violence:   . Fear of Current or Ex-Partner:   . Emotionally Abused:   Marland Kitchen Physically  Abused:   . Sexually Abused:      Current Outpatient Medications:  .  amLODipine (NORVASC) 10 MG tablet, Take 1 tablet (10 mg total) by mouth daily., Disp: 30 tablet, Rfl: 8 .  aspirin EC 81 MG tablet, Take 81 mg by mouth daily. , Disp: , Rfl:  .  esomeprazole (NEXIUM) 40 MG capsule, Take 40 mg by mouth daily. , Disp: , Rfl:  .  glipiZIDE (GLUCOTROL XL) 10 MG 24 hr tablet, Take 1 tablet by mouth daily., Disp: , Rfl: 5 .  insulin detemir (LEVEMIR FLEXTOUCH) 100 UNIT/ML FlexPen, Inject 70 Units into the skin daily., Disp: 15 mL, Rfl: 3 .  losartan (COZAAR) 100 MG tablet, Take 100 mg by mouth daily., Disp: , Rfl:  .  lovastatin (MEVACOR) 20 MG tablet, Take 20 mg by mouth daily., Disp: , Rfl:  .  metFORMIN (GLUCOPHAGE-XR) 500 MG 24 hr tablet, Take 500 mg by mouth daily. , Disp: , Rfl: 11 .  promethazine (PHENERGAN) 25 MG tablet, Take 25 mg by mouth every 6 (six) hours as  needed for nausea or vomiting., Disp: , Rfl:  .  valsartan-hydrochlorothiazide (DIOVAN-HCT) 160-25 MG tablet, Take 1 tablet by mouth daily., Disp: , Rfl: 5 .  albuterol (PROAIR HFA) 108 (90 Base) MCG/ACT inhaler, Inhale 2 puffs into the lungs every 6 (six) hours as needed., Disp: , Rfl:  .  cloNIDine (CATAPRES) 0.1 MG tablet, Take 1 tablet (0.1 mg total) by mouth daily as needed (Blood pressure greater than 180 systolic)., Disp: 30 tablet, Rfl: 0 .  traMADol (ULTRAM) 50 MG tablet, Take 1 tablet (50 mg total) by mouth every 8 (eight) hours as needed for up to 5 days., Disp: 60 tablet, Rfl: 0   No Known Allergies  ROS Review of Systems  Constitutional: Negative.   HENT: Negative.   Eyes: Negative.   Respiratory: Negative.  Negative for shortness of breath.   Cardiovascular: Negative.  Negative for chest pain.  Gastrointestinal: Negative.   Endocrine: Negative.   Genitourinary: Negative.   Musculoskeletal: Negative.  Negative for back pain.       Arthritis in legs, arms, and hands   Skin: Negative.   Allergic/Immunologic: Negative.   Neurological: Negative.   Hematological: Negative.   Psychiatric/Behavioral: Negative.  Negative for sleep disturbance.  All other systems reviewed and are negative.     Objective:    Physical Exam Constitutional:      General: She is not in acute distress.    Appearance: Normal appearance. She is well-developed.     Interventions: Face mask in place.  HENT:     Head: Normocephalic and atraumatic.     Right Ear: Hearing normal.     Left Ear: Hearing normal.     Mouth/Throat:     Mouth: No oral lesions.  Eyes:     Conjunctiva/sclera: Conjunctivae normal.     Pupils: Pupils are equal, round, and reactive to light.  Cardiovascular:     Rate and Rhythm: Normal rate and regular rhythm.     Heart sounds: Normal heart sounds. No murmur heard.  No friction rub. No gallop.   Pulmonary:     Effort: Pulmonary effort is normal.     Breath sounds:  Normal breath sounds. No wheezing, rhonchi or rales.  Chest:     Breasts:        Right: Normal.        Left: Normal.  Abdominal:     General: Bowel sounds are normal.  Palpations: Abdomen is soft. There is no hepatomegaly, splenomegaly or mass.     Tenderness: There is no abdominal tenderness.  Musculoskeletal:        General: No tenderness. Normal range of motion.     Cervical back: Normal range of motion and neck supple.     Right lower leg: Edema (1+) present.     Left lower leg: Edema (trace) present.     Comments: Kyphosis of back  Lymphadenopathy:     Cervical: No cervical adenopathy.     Right cervical: No superficial cervical adenopathy. Skin:    General: Skin is warm and dry.     Findings: No bruising, erythema, lesion or rash.     Comments: Varicose veins on right leg. Small varicose veins on left foot.  Neurological:     Mental Status: She is alert and oriented to person, place, and time.  Psychiatric:        Behavior: Behavior normal.        Thought Content: Thought content normal.        Judgment: Judgment normal.     BP 127/69   Pulse 72   Ht 5\' 8"  (1.727 m)   Wt 210 lb 14.4 oz (95.7 kg)   BMI 32.07 kg/m  Wt Readings from Last 3 Encounters:  02/23/20 210 lb 14.4 oz (95.7 kg)  01/17/20 235 lb (106.6 kg)  01/03/20 220 lb 3.2 oz (99.9 kg)     Health Maintenance Due  Topic Date Due  . Hepatitis C Screening  Never done  . FOOT EXAM  Never done  . OPHTHALMOLOGY EXAM  Never done  . COVID-19 Vaccine (1) Never done  . TETANUS/TDAP  Never done  . DEXA SCAN  Never done  . PNA vac Low Risk Adult (1 of 2 - PCV13) Never done  . HEMOGLOBIN A1C  02/25/2012    There are no preventive care reminders to display for this patient.  Lab Results  Component Value Date   TSH 2.62 08/26/2011   Lab Results  Component Value Date   WBC 5.6 07/01/2019   HGB 14.1 07/01/2019   HCT 42.0 07/01/2019   MCV 79.8 (L) 07/01/2019   PLT 186 07/01/2019   Lab Results    Component Value Date   NA 133 (L) 07/01/2019   K 3.5 07/01/2019   CO2 21 (L) 07/01/2019   GLUCOSE 298 (H) 07/01/2019   BUN 12 07/01/2019   CREATININE 0.58 07/01/2019   BILITOT 0.3 08/18/2013   ALKPHOS 103 08/18/2013   AST 20 08/18/2013   ALT 18 08/18/2013   PROT 7.4 08/18/2013   ALBUMIN 3.5 08/18/2013   CALCIUM 8.7 (L) 07/01/2019   ANIONGAP 16 (H) 07/01/2019   Lab Results  Component Value Date   CHOL 182 08/27/2011   Lab Results  Component Value Date   HDL 37 (L) 08/27/2011   Lab Results  Component Value Date   LDLCALC 124 (H) 08/27/2011   Lab Results  Component Value Date   TRIG 105 08/27/2011   No results found for: CHOLHDL Lab Results  Component Value Date   HGBA1C 9.1 (H) 08/28/2011      Assessment & Plan:   Problem List Items Addressed This Visit      Cardiovascular and Mediastinum   HTN (hypertension)    Blood pressure is 127/69 today.  Her BMI is 32.07.  She was advised to continue taking her .  Losartan 100 mg tablet p.o. daily.  Statin for the diabetes  and takes Metformin for the diabetes also.        Digestive   GERD (gastroesophageal reflux disease)    Is a chronic reflux problem with GERD.  As well as control of diabetes.  Advised to avoid spicy food.        Endocrine   Type 2 diabetes mellitus (HCC)    He has obesity diabetic related syndrome.  Her blood sugar is 159 and I told her to work on blood sugar so she can bring it down to at least 110.        Musculoskeletal and Integument   Osteoarthritis of left knee - Primary    Patient is overweight.  Also has bilateral osteoarthritis of the knee.  Both hands.  Has trace pedal edema with varicose vein. she is overweight and has been told to lose weight that might help with the arthritic pain.      Relevant Medications   traMADol (ULTRAM) 50 MG tablet      Meds ordered this encounter  Medications  . traMADol (ULTRAM) 50 MG tablet    Sig: Take 1 tablet (50 mg total) by mouth every 8  (eight) hours as needed for up to 5 days.    Dispense:  60 tablet    Refill:  0    Follow-up: Return in about 2 months (around 04/25/2020).   By signing my name below, I, Pietro Cassis, attest that this documentation has been prepared under the direction and in the presence of Corky Downs, MD. Electronically Signed: Pietro Cassis, Medical Scribe. 02/23/20. 11:36 AM.  I personally performed the services described in this documentation, which was SCRIBED in my presence. The recorded information has been reviewed and considered accurate. It has been edited as necessary during review. Corky Downs, MD

## 2020-02-23 NOTE — Assessment & Plan Note (Signed)
He has obesity diabetic related syndrome.  Her blood sugar is 159 and I told her to work on blood sugar so she can bring it down to at least 110.

## 2020-02-23 NOTE — Assessment & Plan Note (Signed)
Is a chronic reflux problem with GERD.  As well as control of diabetes.  Advised to avoid spicy food.

## 2020-02-27 ENCOUNTER — Other Ambulatory Visit: Payer: Self-pay | Admitting: *Deleted

## 2020-02-27 MED ORDER — PEN NEEDLES 31G X 8 MM MISC: 3 refills | Status: DC

## 2020-02-27 MED ORDER — METFORMIN HCL ER 500 MG PO TB24: 500.0000 mg | ORAL_TABLET | Freq: Every day | ORAL | 3 refills | Status: DC

## 2020-03-22 ENCOUNTER — Other Ambulatory Visit: Payer: Self-pay

## 2020-03-22 MED ORDER — BUDESONIDE-FORMOTEROL FUMARATE 80-4.5 MCG/ACT IN AERO: 2.0000 | INHALATION_SPRAY | Freq: Two times a day (BID) | RESPIRATORY_TRACT | 3 refills | Status: DC

## 2020-04-03 DIAGNOSIS — H2512 Age-related nuclear cataract, left eye: Secondary | ICD-10-CM | POA: Diagnosis not present

## 2020-04-03 DIAGNOSIS — E1159 Type 2 diabetes mellitus with other circulatory complications: Secondary | ICD-10-CM | POA: Diagnosis not present

## 2020-04-05 ENCOUNTER — Ambulatory Visit: Payer: Medicare Other | Admitting: Internal Medicine

## 2020-04-17 ENCOUNTER — Encounter: Payer: Self-pay | Admitting: Ophthalmology

## 2020-04-17 ENCOUNTER — Other Ambulatory Visit: Payer: Self-pay

## 2020-04-17 ENCOUNTER — Other Ambulatory Visit
Admission: RE | Admit: 2020-04-17 | Discharge: 2020-04-17 | Disposition: A | Discharge status: Home / Self Care | Payer: Medicare Other | Source: Ambulatory Visit | Attending: Ophthalmology | Admitting: Ophthalmology

## 2020-04-17 DIAGNOSIS — Z20822 Contact with and (suspected) exposure to covid-19: Secondary | ICD-10-CM | POA: Diagnosis not present

## 2020-04-17 DIAGNOSIS — Z01812 Encounter for preprocedural laboratory examination: Secondary | ICD-10-CM | POA: Diagnosis not present

## 2020-04-17 NOTE — Discharge Instructions (Signed)

## 2020-04-18 LAB — SARS CORONAVIRUS 2 (TAT 6-24 HRS): SARS Coronavirus 2: NEGATIVE

## 2020-04-19 ENCOUNTER — Encounter
Admission: RE | Disposition: A | Discharge status: Home / Self Care | Payer: Self-pay | Source: Home / Self Care | Attending: Ophthalmology

## 2020-04-19 ENCOUNTER — Encounter: Payer: Self-pay | Admitting: Ophthalmology

## 2020-04-19 ENCOUNTER — Ambulatory Visit
Admission: RE | Admit: 2020-04-19 | Discharge: 2020-04-19 | Disposition: A | Discharge status: Home / Self Care | Payer: Medicare Other | Attending: Ophthalmology | Admitting: Ophthalmology

## 2020-04-19 ENCOUNTER — Other Ambulatory Visit: Payer: Self-pay

## 2020-04-19 ENCOUNTER — Ambulatory Visit: Payer: Medicare Other | Admitting: Anesthesiology

## 2020-04-19 DIAGNOSIS — Z8616 Personal history of COVID-19: Secondary | ICD-10-CM | POA: Insufficient documentation

## 2020-04-19 DIAGNOSIS — I10 Essential (primary) hypertension: Secondary | ICD-10-CM | POA: Diagnosis not present

## 2020-04-19 DIAGNOSIS — J45909 Unspecified asthma, uncomplicated: Secondary | ICD-10-CM | POA: Diagnosis not present

## 2020-04-19 DIAGNOSIS — M1990 Unspecified osteoarthritis, unspecified site: Secondary | ICD-10-CM | POA: Diagnosis not present

## 2020-04-19 DIAGNOSIS — Z794 Long term (current) use of insulin: Secondary | ICD-10-CM | POA: Insufficient documentation

## 2020-04-19 DIAGNOSIS — E1136 Type 2 diabetes mellitus with diabetic cataract: Secondary | ICD-10-CM | POA: Diagnosis not present

## 2020-04-19 DIAGNOSIS — Z7982 Long term (current) use of aspirin: Secondary | ICD-10-CM | POA: Diagnosis not present

## 2020-04-19 DIAGNOSIS — E7800 Pure hypercholesterolemia, unspecified: Secondary | ICD-10-CM | POA: Insufficient documentation

## 2020-04-19 DIAGNOSIS — Z79899 Other long term (current) drug therapy: Secondary | ICD-10-CM | POA: Diagnosis not present

## 2020-04-19 DIAGNOSIS — H2512 Age-related nuclear cataract, left eye: Secondary | ICD-10-CM | POA: Insufficient documentation

## 2020-04-19 DIAGNOSIS — H25812 Combined forms of age-related cataract, left eye: Secondary | ICD-10-CM | POA: Diagnosis not present

## 2020-04-19 HISTORY — PX: CATARACT EXTRACTION W/PHACO: SHX586

## 2020-04-19 LAB — GLUCOSE, CAPILLARY
Glucose-Capillary: 286 mg/dL — ABNORMAL HIGH (ref 70–99)
Glucose-Capillary: 248 mg/dL — ABNORMAL HIGH (ref 70–99)

## 2020-04-19 SURGERY — PHACOEMULSIFICATION, CATARACT, WITH IOL INSERTION
Anesthesia: Monitor Anesthesia Care | Site: Eye | Laterality: Left

## 2020-04-19 MED ORDER — ARMC OPHTHALMIC DILATING DROPS
1.0000 "application " | OPHTHALMIC | Status: DC | PRN
  Administered 2020-04-19 (×3): 1 via OPHTHALMIC

## 2020-04-19 MED ORDER — LACTATED RINGERS IV SOLN: INTRAVENOUS | Status: DC

## 2020-04-19 MED ORDER — LIDOCAINE HCL (PF) 2 % IJ SOLN
INTRAOCULAR | Status: DC | PRN
  Administered 2020-04-19: 1 mL

## 2020-04-19 MED ORDER — BRIMONIDINE TARTRATE-TIMOLOL 0.2-0.5 % OP SOLN
OPHTHALMIC | Status: DC | PRN
  Administered 2020-04-19: 1 [drp] via OPHTHALMIC

## 2020-04-19 MED ORDER — MOXIFLOXACIN HCL 0.5 % OP SOLN
OPHTHALMIC | Status: DC | PRN
  Administered 2020-04-19: 0.2 mL via OPHTHALMIC

## 2020-04-19 MED ORDER — NA CHONDROIT SULF-NA HYALURON 40-17 MG/ML IO SOLN
INTRAOCULAR | Status: DC | PRN
  Administered 2020-04-19: 1 mL via INTRAOCULAR

## 2020-04-19 MED ORDER — EPINEPHRINE PF 1 MG/ML IJ SOLN
INTRAOCULAR | Status: DC | PRN
  Administered 2020-04-19: 53 mL via OPHTHALMIC

## 2020-04-19 MED ORDER — FENTANYL CITRATE (PF) 100 MCG/2ML IJ SOLN
INTRAMUSCULAR | Status: DC | PRN
Start: 2020-04-19 — End: 2020-04-19
  Administered 2020-04-19: 50 ug via INTRAVENOUS

## 2020-04-19 MED ORDER — TETRACAINE HCL 0.5 % OP SOLN
1.0000 [drp] | OPHTHALMIC | Status: DC | PRN
  Administered 2020-04-19 (×3): 1 [drp] via OPHTHALMIC

## 2020-04-19 MED ORDER — MIDAZOLAM HCL 2 MG/2ML IJ SOLN
INTRAMUSCULAR | Status: DC | PRN
  Administered 2020-04-19: 1 mg via INTRAVENOUS

## 2020-04-19 SURGICAL SUPPLY — 19 items
CANNULA ANT/CHMB 27GA (MISCELLANEOUS) ×4 IMPLANT
GLOVE SURG LX 8.0 MICRO (GLOVE) ×2
GLOVE SURG LX STRL 8.0 MICRO (GLOVE) ×2 IMPLANT
GLOVE SURG TRIUMPH 8.0 PF LTX (GLOVE) ×2 IMPLANT
GOWN STRL REUS W/ TWL LRG LVL3 (GOWN DISPOSABLE) ×2 IMPLANT
GOWN STRL REUS W/TWL LRG LVL3 (GOWN DISPOSABLE) ×4
LENS IOL TECNIS ITEC 21.5 (Intraocular Lens) ×2 IMPLANT
MARKER SKIN DUAL TIP RULER LAB (MISCELLANEOUS) ×2 IMPLANT
NEEDLE FILTER BLUNT 18X 1/2SAF (NEEDLE) ×1
NEEDLE FILTER BLUNT 18X1 1/2 (NEEDLE) ×1 IMPLANT
PACK EYE AFTER SURG (MISCELLANEOUS) ×2 IMPLANT
PACK OPTHALMIC (MISCELLANEOUS) ×2 IMPLANT
PACK PORFILIO (MISCELLANEOUS) ×2 IMPLANT
SUT ETHILON 10-0 CS-B-6CS-B-6 (SUTURE)
SUTURE EHLN 10-0 CS-B-6CS-B-6 (SUTURE) IMPLANT
SYR 3ML LL SCALE MARK (SYRINGE) ×2 IMPLANT
SYR TB 1ML LUER SLIP (SYRINGE) ×2 IMPLANT
WATER STERILE IRR 250ML POUR (IV SOLUTION) ×2 IMPLANT
WIPE NON LINTING 3.25X3.25 (MISCELLANEOUS) ×2 IMPLANT

## 2020-04-19 NOTE — Op Note (Signed)
PREOPERATIVE DIAGNOSIS:  Nuclear sclerotic cataract of the left eye.   POSTOPERATIVE DIAGNOSIS:  Nuclear sclerotic cataract of the left eye.   OPERATIVE PROCEDURE:@   SURGEON:  Galen Manila, MD.   ANESTHESIA:  Anesthesiologist: Jolayne Panther, MD CRNA: Jimmy Picket, CRNA  1.      Managed anesthesia care. 2.     0.19ml of Shugarcaine was instilled following the paracentesis   COMPLICATIONS:  None.   TECHNIQUE:   Stop and chop   DESCRIPTION OF PROCEDURE:  The patient was examined and consented in the preoperative holding area where the aforementioned topical anesthesia was applied to the left eye and then brought back to the Operating Room where the left eye was prepped and draped in the usual sterile ophthalmic fashion and a lid speculum was placed. A paracentesis was created with the side port blade and the anterior chamber was filled with viscoelastic. A near clear corneal incision was performed with the steel keratome. A continuous curvilinear capsulorrhexis was performed with a cystotome followed by the capsulorrhexis forceps. Hydrodissection and hydrodelineation were carried out with BSS on a blunt cannula. The lens was removed in a stop and chop  technique and the remaining cortical material was removed with the irrigation-aspiration handpiece. The capsular bag was inflated with viscoelastic and the Technis [id number] lens was placed in the capsular bag without complication. The remaining viscoelastic was removed from the eye with the irrigation-aspiration handpiece. The wounds were hydrated. The anterior chamber was flushed with BSS and the eye was inflated to physiologic pressure. 0.61ml Vigamox was placed in the anterior chamber. The wounds were found to be water tight. The eye was dressed with Combigan. The patient was given protective glasses to wear throughout the day and a shield with which to sleep tonight. The patient was also given drops with which to begin a drop regimen today and  will follow-up with me in one day. Implant Name Type Inv. Item Serial No. Manufacturer Lot No. LRB No. Used Action  LENS IOL DIOP 21.5 - [id number] Intraocular Lens LENS IOL DIOP 21.5 [id number]   Left 1 Implanted    Procedure(s) with comments: CATARACT EXTRACTION PHACO AND INTRAOCULAR LENS PLACEMENT (IOC) LEFT DIABETIC (Left) - 8.48 0:49.6  Electronically signed: Galen Manila 04/19/2020 10:29 AM

## 2020-04-19 NOTE — Transfer of Care (Signed)
Immediate Anesthesia Transfer of Care Note  Patient: Mary Hodges  Procedure(s) Performed: CATARACT EXTRACTION PHACO AND INTRAOCULAR LENS PLACEMENT (IOC) LEFT DIABETIC (Left Eye)  Patient Location: PACU  Anesthesia Type: MAC  Level of Consciousness: awake, alert  and patient cooperative  Airway and Oxygen Therapy: Patient Spontanous Breathing and Patient connected to supplemental oxygen  Post-op Assessment: Post-op Vital signs reviewed, Patient's Cardiovascular Status Stable, Respiratory Function Stable, Patent Airway and No signs of Nausea or vomiting  Post-op Vital Signs: Reviewed and stable  Complications: No complications documented.

## 2020-04-19 NOTE — H&P (Signed)
All labs reviewed. Abnormal studies sent to patients PCP when indicated.  Previous H&P reviewed, patient examined, there are NO CHANGES.  Mary Eugene Porfilio9/9/202110:06 AM

## 2020-04-19 NOTE — Anesthesia Procedure Notes (Signed)
Procedure Name: MAC Performed by: Rayyan Burley, CRNA Pre-anesthesia Checklist: Patient identified, Emergency Drugs available, Suction available, Timeout performed and Patient being monitored Patient Re-evaluated:Patient Re-evaluated prior to induction Oxygen Delivery Method: Nasal cannula Placement Confirmation: positive ETCO2       

## 2020-04-19 NOTE — Anesthesia Preprocedure Evaluation (Addendum)
Anesthesia Evaluation  Patient identified by MRN, date of birth, ID band Patient awake    Reviewed: Allergy & Precautions, H&P , NPO status , Patient's Chart, lab work & pertinent test results  History of Anesthesia Complications Negative for: history of anesthetic complications  Airway Mallampati: II  TM Distance: >3 FB Neck ROM: full    Dental no notable dental hx.    Pulmonary asthma ,    Pulmonary exam normal        Cardiovascular hypertension, Normal cardiovascular exam Rhythm:regular Rate:Normal     Neuro/Psych    GI/Hepatic Neg liver ROS, Medicated,  Endo/Other  diabetes, Well Controlled, Type 2, Insulin Dependent  Renal/GU   negative genitourinary   Musculoskeletal   Abdominal   Peds  Hematology negative hematology ROS (+)   Anesthesia Other Findings   Reproductive/Obstetrics                            Anesthesia Physical Anesthesia Plan  ASA: III  Anesthesia Plan: MAC   Post-op Pain Management:    Induction:   PONV Risk Score and Plan: 2 and Treatment may vary due to age or medical condition  Airway Management Planned:   Additional Equipment:   Intra-op Plan:   Post-operative Plan:   Informed Consent: I have reviewed the patients History and Physical, chart, labs and discussed the procedure including the risks, benefits and alternatives for the proposed anesthesia with the patient or authorized representative who has indicated his/her understanding and acceptance.       Plan Discussed with:   Anesthesia Plan Comments:         Anesthesia Quick Evaluation

## 2020-04-19 NOTE — Anesthesia Postprocedure Evaluation (Signed)
Anesthesia Post Note  Patient: Mary Hodges  Procedure(s) Performed: CATARACT EXTRACTION PHACO AND INTRAOCULAR LENS PLACEMENT (IOC) LEFT DIABETIC (Left Eye)     Patient location during evaluation: PACU Anesthesia Type: MAC Level of consciousness: awake and alert Pain management: pain level controlled Vital Signs Assessment: post-procedure vital signs reviewed and stable Respiratory status: spontaneous breathing Cardiovascular status: stable Postop Assessment: no headache Anesthetic complications: no   No complications documented.  Gillian Scarce

## 2020-04-20 ENCOUNTER — Encounter: Payer: Self-pay | Admitting: Ophthalmology

## 2020-05-07 ENCOUNTER — Ambulatory Visit: Payer: Medicare Other | Admitting: Internal Medicine

## 2020-05-16 ENCOUNTER — Ambulatory Visit (INDEPENDENT_AMBULATORY_CARE_PROVIDER_SITE_OTHER): Payer: Medicare Other | Admitting: Internal Medicine

## 2020-05-16 ENCOUNTER — Encounter: Payer: Self-pay | Admitting: Internal Medicine

## 2020-05-16 ENCOUNTER — Other Ambulatory Visit: Payer: Self-pay

## 2020-05-16 VITALS — BP 157/89 | HR 82 | Ht 65.0 in | Wt 213.4 lb

## 2020-05-16 DIAGNOSIS — Z23 Encounter for immunization: Secondary | ICD-10-CM

## 2020-05-16 DIAGNOSIS — M1712 Unilateral primary osteoarthritis, left knee: Secondary | ICD-10-CM | POA: Diagnosis not present

## 2020-05-16 DIAGNOSIS — I10 Essential (primary) hypertension: Secondary | ICD-10-CM

## 2020-05-16 DIAGNOSIS — Z0000 Encounter for general adult medical examination without abnormal findings: Secondary | ICD-10-CM | POA: Insufficient documentation

## 2020-05-16 DIAGNOSIS — E782 Mixed hyperlipidemia: Secondary | ICD-10-CM | POA: Diagnosis not present

## 2020-05-16 DIAGNOSIS — E1169 Type 2 diabetes mellitus with other specified complication: Secondary | ICD-10-CM

## 2020-05-16 MED ORDER — TRAMADOL HCL 50 MG PO TABS: 50.0000 mg | ORAL_TABLET | Freq: Three times a day (TID) | ORAL | 0 refills | Status: DC | PRN

## 2020-05-16 MED ORDER — LOSARTAN POTASSIUM 100 MG PO TABS: 100.0000 mg | ORAL_TABLET | Freq: Every day | ORAL | 3 refills | Status: DC

## 2020-05-16 NOTE — Assessment & Plan Note (Addendum)
Patient is known to have multiple joint arthritis.  She has trouble walking because of swelling of the both knees.  She has seen orthopedic surgeon.  Patient has a problem with exogenous obesity and she was advised to lose weight.  She is also known to have reflux esophagitis hypertension diabetes and dyslipidemia I told her to stay active and try to walk daily to lose weight, control her blood sugar, get a Covid shot if not taken also suggested a flu vaccine.

## 2020-05-16 NOTE — Progress Notes (Signed)
Established Patient Office Visit  Subjective:  Patient ID: Mary Hodges, female    DOB: 02/24/1941  Age: 79 y.o. MRN: [id number]  CC:  Chief Complaint  Patient presents with  . Osteoarthritis    patient needs refill of tramadol     HPI  Mary Hodges presents for arthritis of the both knee joint.  Past Medical History:  Diagnosis Date  . Arthritis   . Asthma   . GERD (gastroesophageal reflux disease)   . HLD (hyperlipidemia)   . HTN (hypertension)   . Hypercholesteremia   . Hypertension   . Type 2 diabetes mellitus (HCC)     Past Surgical History:  Procedure Laterality Date  . CATARACT EXTRACTION W/PHACO Right 01/19/2020   Procedure: CATARACT EXTRACTION PHACO AND INTRAOCULAR LENS PLACEMENT (IOC) RIGHT DIABETIC;  Surgeon: Galen Manila, MD;  Location: [facility];  Service: Ophthalmology;  Laterality: Right;  US1:18.4 CDE9.08 LOT [id number] H  . CATARACT EXTRACTION W/PHACO Left 04/19/2020   Procedure: CATARACT EXTRACTION PHACO AND INTRAOCULAR LENS PLACEMENT (IOC) LEFT DIABETIC;  Surgeon: Galen Manila, MD;  Location: [facility];  Service: Ophthalmology;  Laterality: Left;  8.48 0:49.6  . left toe surgery      Family History  Problem Relation Age of Onset  . CAD Father   . CVA Mother     Social History   Socioeconomic History  . Marital status: Widowed    Spouse name: Not on file  . Number of children: Not on file  . Years of education: Not on file  . Highest education level: Not on file  Occupational History  . Not on file  Tobacco Use  . Smoking status: Never Smoker  . Smokeless tobacco: Never Used  Vaping Use  . Vaping Use: Never used  Substance and Sexual Activity  . Alcohol use: No    Alcohol/week: 0.0 standard drinks  . Drug use: No  . Sexual activity: Not on file  Other Topics Concern  . Not on file  Social History Narrative  . Not on file   Social Determinants of Health   Financial Resource Strain:   . Difficulty of Paying  Living Expenses: Not on file  Food Insecurity:   . Worried About Programme researcher, broadcasting/film/video in the Last Year: Not on file  . Ran Out of Food in the Last Year: Not on file  Transportation Needs:   . Lack of Transportation (Medical): Not on file  . Lack of Transportation (Non-Medical): Not on file  Physical Activity:   . Days of Exercise per Week: Not on file  . Minutes of Exercise per Session: Not on file  Stress:   . Feeling of Stress : Not on file  Social Connections:   . Frequency of Communication with Friends and Family: Not on file  . Frequency of Social Gatherings with Friends and Family: Not on file  . Attends Religious Services: Not on file  . Active Member of Clubs or Organizations: Not on file  . Attends Banker Meetings: Not on file  . Marital Status: Not on file  Intimate Partner Violence:   . Fear of Current or Ex-Partner: Not on file  . Emotionally Abused: Not on file  . Physically Abused: Not on file  . Sexually Abused: Not on file     Current Outpatient Medications:  .  amLODipine (NORVASC) 10 MG tablet, Take 1 tablet (10 mg total) by mouth daily., Disp: 30 tablet, Rfl: 8 .  aspirin EC 81  MG tablet, Take 81 mg by mouth daily. , Disp: , Rfl:  .  budesonide-formoterol (SYMBICORT) 80-4.5 MCG/ACT inhaler, Inhale 2 puffs into the lungs 2 (two) times daily., Disp: 10.2 g, Rfl: 3 .  esomeprazole (NEXIUM) 40 MG capsule, Take 40 mg by mouth daily. , Disp: , Rfl:  .  glipiZIDE (GLUCOTROL XL) 10 MG 24 hr tablet, Take 1 tablet by mouth daily., Disp: , Rfl: 5 .  insulin detemir (LEVEMIR FLEXTOUCH) 100 UNIT/ML FlexPen, Inject 70 Units into the skin daily., Disp: 15 mL, Rfl: 3 .  Insulin Pen Needle (PEN NEEDLES) 31G X 8 MM MISC, USE TO INJECT INSULIN AS DIRECTED, Disp: 100 each, Rfl: 3 .  losartan (COZAAR) 100 MG tablet, Take 1 tablet (100 mg total) by mouth daily., Disp: 90 tablet, Rfl: 3 .  lovastatin (MEVACOR) 20 MG tablet, Take 20 mg by mouth daily., Disp: , Rfl:  .   metFORMIN (GLUCOPHAGE-XR) 500 MG 24 hr tablet, Take 1 tablet (500 mg total) by mouth daily., Disp: 90 tablet, Rfl: 3 .  promethazine (PHENERGAN) 25 MG tablet, Take 25 mg by mouth every 6 (six) hours as needed for nausea or vomiting., Disp: , Rfl:  .  valsartan-hydrochlorothiazide (DIOVAN-HCT) 160-25 MG tablet, Take 1 tablet by mouth daily., Disp: , Rfl: 5 .  albuterol (PROAIR HFA) 108 (90 Base) MCG/ACT inhaler, Inhale 2 puffs into the lungs every 6 (six) hours as needed., Disp: , Rfl:  .  cloNIDine (CATAPRES) 0.1 MG tablet, Take 1 tablet (0.1 mg total) by mouth daily as needed (Blood pressure greater than 180 systolic)., Disp: 30 tablet, Rfl: 0 .  traMADol (ULTRAM) 50 MG tablet, Take 1 tablet (50 mg total) by mouth every 8 (eight) hours as needed., Disp: 60 tablet, Rfl: 0   No Known Allergies  ROS Review of Systems  Constitutional: Negative.   HENT: Negative.   Eyes: Negative.   Respiratory: Negative.   Cardiovascular: Negative.   Gastrointestinal: Negative.   Endocrine: Negative.   Genitourinary: Negative.   Musculoskeletal: Positive for arthralgias.       Complain of both knee pain  Skin: Negative.   Allergic/Immunologic: Negative.   Neurological: Negative.   Hematological: Negative.   Psychiatric/Behavioral: Negative.   All other systems reviewed and are negative.     Objective:    Physical Exam Vitals reviewed.  Constitutional:      Appearance: Normal appearance.  HENT:     Mouth/Throat:     Mouth: Mucous membranes are moist.  Eyes:     Pupils: Pupils are equal, round, and reactive to light.  Neck:     Vascular: No carotid bruit.  Cardiovascular:     Rate and Rhythm: Normal rate and regular rhythm.     Pulses: Normal pulses.     Heart sounds: Normal heart sounds.  Pulmonary:     Effort: Pulmonary effort is normal.     Breath sounds: Normal breath sounds.  Abdominal:     General: Bowel sounds are normal.     Palpations: Abdomen is soft. There is no hepatomegaly,  splenomegaly or mass.     Tenderness: There is no abdominal tenderness.     Hernia: No hernia is present.  Musculoskeletal:        General: Swelling present.     Cervical back: Neck supple.     Right knee: Swelling present.     Left knee: Swelling present.     Right lower leg: Tenderness present. No edema.     Left  lower leg: Tenderness present. No edema.     Comments: Patient hurt in the movements of the both knees and also has crepitus present in both knees  Skin:    Findings: No rash.  Neurological:     Mental Status: She is alert and oriented to person, place, and time.     Motor: No weakness.  Psychiatric:        Mood and Affect: Mood and affect normal.        Behavior: Behavior normal.     BP (!) 157/89   Pulse 82   Ht 5\' 5"  (1.651 m)   Wt 213 lb 6.4 oz (96.8 kg)   BMI 35.51 kg/m  Wt Readings from Last 3 Encounters:  05/16/20 213 lb 6.4 oz (96.8 kg)  04/19/20 235 lb 0.2 oz (106.6 kg)  02/23/20 210 lb 14.4 oz (95.7 kg)     Health Maintenance Due  Topic Date Due  . Hepatitis C Screening  Never done  . FOOT EXAM  Never done  . OPHTHALMOLOGY EXAM  Never done  . COVID-19 Vaccine (1) Never done  . TETANUS/TDAP  Never done  . DEXA SCAN  Never done  . PNA vac Low Risk Adult (1 of 2 - PCV13) Never done  . HEMOGLOBIN A1C  02/25/2012    There are no preventive care reminders to display for this patient.  Lab Results  Component Value Date   TSH 2.62 08/26/2011   Lab Results  Component Value Date   WBC 5.6 07/01/2019   HGB 14.1 07/01/2019   HCT 42.0 07/01/2019   MCV 79.8 (L) 07/01/2019   PLT 186 07/01/2019   Lab Results  Component Value Date   NA 133 (L) 07/01/2019   K 3.5 07/01/2019   CO2 21 (L) 07/01/2019   GLUCOSE 298 (H) 07/01/2019   BUN 12 07/01/2019   CREATININE 0.58 07/01/2019   BILITOT 0.3 08/18/2013   ALKPHOS 103 08/18/2013   AST 20 08/18/2013   ALT 18 08/18/2013   PROT 7.4 08/18/2013   ALBUMIN 3.5 08/18/2013   CALCIUM 8.7 (L) 07/01/2019    ANIONGAP 16 (H) 07/01/2019   Lab Results  Component Value Date   CHOL 182 08/27/2011   Lab Results  Component Value Date   HDL 37 (L) 08/27/2011   Lab Results  Component Value Date   LDLCALC 124 (H) 08/27/2011   Lab Results  Component Value Date   TRIG 105 08/27/2011   No results found for: CHOLHDL Lab Results  Component Value Date   HGBA1C 9.1 (H) 08/28/2011      Assessment & Plan:   Problem List Items Addressed This Visit      Cardiovascular and Mediastinum   HTN (hypertension)    Patient is taking her blood pressure medication she was advised to continue using the pills.  She was advised to lose weight.      Relevant Medications   losartan (COZAAR) 100 MG tablet     Endocrine   Type 2 diabetes mellitus with other specified complication (HCC)    - The patient's blood sugar is not under control on levimeier  70 units  daily. - The patient will continue the current treatment regimen.  - I encouraged the patient to regularly check blood sugar.  - I encouraged the patient to monitor diet. I encouraged the patient to eat low-carb and low-sugar to help prevent blood sugar spikes.  - I encouraged the patient to continue following their prescribed treatment plan for  diabetes - I informed the patient to get help if blood sugar drops below 54mg /dL, or if suddenly have trouble thinking clearly or breathing.  Patient has been advised strongly to lose weight.  Will come come back every month to the blood sugar is under control we have to do a hemoglobin AIC as soon as possible she was also advised to see an eye doctor and repeat the complete blood test.         Relevant Medications   losartan (COZAAR) 100 MG tablet     Musculoskeletal and Integument   Osteoarthritis of left knee - Primary    Patient has osteoarthritis of the both knees and I told her to lose weight.  We will refer her to orthopedic surgeon for evaluation        Other   HLD (hyperlipidemia)    She was  advised to continue her statin and follow low-cholesterol diet      Relevant Medications   losartan (COZAAR) 100 MG tablet   Need for influenza vaccination   Relevant Orders   Flu Vaccine QUAD High Dose(Fluad) (Completed)   Preventative health care    Patient is known to have multiple joint arthritis.  She has trouble walking because of swelling of the both knees.  She has seen orthopedic surgeon.  Patient has a problem with exogenous obesity and she was advised to lose weight.  She is also known to have reflux esophagitis hypertension diabetes and dyslipidemia I told her to stay active and try to walk daily to lose weight, control her blood sugar, get a Covid shot if not taken also suggested a flu vaccine.         Meds ordered this encounter  Medications  . DISCONTD: traMADol (ULTRAM) 50 MG tablet    Sig: Take 1 tablet (50 mg total) by mouth every 8 (eight) hours as needed for up to 5 days.    Dispense:  15 tablet    Refill:  0  . losartan (COZAAR) 100 MG tablet    Sig: Take 1 tablet (100 mg total) by mouth daily.    Dispense:  90 tablet    Refill:  3    Follow-up: No follow-ups on file.    , MD

## 2020-05-16 NOTE — Assessment & Plan Note (Signed)
Patient is taking her blood pressure medication she was advised to continue using the pills.  She was advised to lose weight.

## 2020-05-17 ENCOUNTER — Other Ambulatory Visit: Payer: Self-pay | Admitting: *Deleted

## 2020-05-17 MED ORDER — TRAMADOL HCL 50 MG PO TABS
50.0000 mg | ORAL_TABLET | Freq: Three times a day (TID) | ORAL | 0 refills | Status: DC | PRN
Start: 2020-05-17 — End: 2020-06-15

## 2020-05-18 NOTE — Assessment & Plan Note (Addendum)
-   The patient's blood sugar is not under control on levimeier  70 units  daily. - The patient will continue the current treatment regimen.  - I encouraged the patient to regularly check blood sugar.  - I encouraged the patient to monitor diet. I encouraged the patient to eat low-carb and low-sugar to help prevent blood sugar spikes.  - I encouraged the patient to continue following their prescribed treatment plan for diabetes - I informed the patient to get help if blood sugar drops below 54mg /dL, or if suddenly have trouble thinking clearly or breathing.  Patient has been advised strongly to lose weight.  Will come come back every month to the blood sugar is under control we have to do a hemoglobin AIC as soon as possible she was also advised to see an eye doctor and repeat the complete blood test.

## 2020-05-18 NOTE — Assessment & Plan Note (Signed)
Patient has osteoarthritis of the both knees and I told her to lose weight.  We will refer her to orthopedic surgeon for evaluation

## 2020-05-18 NOTE — Assessment & Plan Note (Signed)
She was advised to continue her statin and follow low-cholesterol diet

## 2020-06-15 ENCOUNTER — Other Ambulatory Visit: Payer: Self-pay | Admitting: Internal Medicine

## 2020-07-17 ENCOUNTER — Encounter: Payer: Medicare Other | Admitting: Internal Medicine

## 2020-07-27 ENCOUNTER — Other Ambulatory Visit: Payer: Self-pay | Admitting: Internal Medicine

## 2020-07-27 DIAGNOSIS — Z1331 Encounter for screening for depression: Secondary | ICD-10-CM

## 2020-07-31 ENCOUNTER — Ambulatory Visit (INDEPENDENT_AMBULATORY_CARE_PROVIDER_SITE_OTHER): Payer: Medicare Other | Admitting: Internal Medicine

## 2020-07-31 ENCOUNTER — Encounter: Payer: Self-pay | Admitting: Internal Medicine

## 2020-07-31 ENCOUNTER — Other Ambulatory Visit: Payer: Self-pay

## 2020-07-31 VITALS — BP 159/71 | HR 65 | Ht 64.0 in | Wt 208.8 lb

## 2020-07-31 DIAGNOSIS — Z0000 Encounter for general adult medical examination without abnormal findings: Secondary | ICD-10-CM

## 2020-07-31 DIAGNOSIS — I10 Essential (primary) hypertension: Secondary | ICD-10-CM

## 2020-07-31 DIAGNOSIS — K219 Gastro-esophageal reflux disease without esophagitis: Secondary | ICD-10-CM | POA: Diagnosis not present

## 2020-07-31 DIAGNOSIS — E1169 Type 2 diabetes mellitus with other specified complication: Secondary | ICD-10-CM

## 2020-07-31 DIAGNOSIS — M1712 Unilateral primary osteoarthritis, left knee: Secondary | ICD-10-CM

## 2020-07-31 MED ORDER — METFORMIN HCL ER 500 MG PO TB24: 500.0000 mg | ORAL_TABLET | Freq: Every day | ORAL | 3 refills | Status: DC

## 2020-07-31 MED ORDER — TRAMADOL HCL 50 MG PO TABS: 50.0000 mg | ORAL_TABLET | Freq: Three times a day (TID) | ORAL | 0 refills | Status: DC | PRN

## 2020-07-31 MED ORDER — TRAMADOL HCL 50 MG PO TABS
50.0000 mg | ORAL_TABLET | Freq: Three times a day (TID) | ORAL | 0 refills | Status: DC | PRN
Start: 2020-07-31 — End: 2020-07-31

## 2020-07-31 NOTE — Assessment & Plan Note (Signed)
-   The patient's GERD is stable on medication.  - Instructed the patient to avoid eating spicy and acidic foods, as well as foods high in fat. - Instructed the patient to avoid eating large meals or meals 2-3 hours prior to sleeping. 

## 2020-07-31 NOTE — Progress Notes (Signed)
Established Patient Office Visit  Subjective:  Patient ID: Mary Hodges, female    DOB: Jul 05, 1941  Age: 79 y.o. MRN: [id number]  CC:  Chief Complaint  Patient presents with  . Annual Exam    HPI  Mary Hodges presents for physical  Past Medical History:  Diagnosis Date  . Arthritis   . Asthma   . GERD (gastroesophageal reflux disease)   . HLD (hyperlipidemia)   . HTN (hypertension)   . Hypercholesteremia   . Hypertension   . Type 2 diabetes mellitus (HCC)     Past Surgical History:  Procedure Laterality Date  . CATARACT EXTRACTION W/PHACO Right 01/19/2020   Procedure: CATARACT EXTRACTION PHACO AND INTRAOCULAR LENS PLACEMENT (IOC) RIGHT DIABETIC;  Surgeon: Galen Manila, MD;  Location: [facility];  Service: Ophthalmology;  Laterality: Right;  US1:18.4 CDE9.08 LOT [id number] H  . CATARACT EXTRACTION W/PHACO Left 04/19/2020   Procedure: CATARACT EXTRACTION PHACO AND INTRAOCULAR LENS PLACEMENT (IOC) LEFT DIABETIC;  Surgeon: Galen Manila, MD;  Location: [facility];  Service: Ophthalmology;  Laterality: Left;  8.48 0:49.6  . left toe surgery      Family History  Problem Relation Age of Onset  . CAD Father   . CVA Mother     Social History   Socioeconomic History  . Marital status: Widowed    Spouse name: Not on file  . Number of children: Not on file  . Years of education: Not on file  . Highest education level: Not on file  Occupational History  . Not on file  Tobacco Use  . Smoking status: Never Smoker  . Smokeless tobacco: Never Used  Vaping Use  . Vaping Use: Never used  Substance and Sexual Activity  . Alcohol use: No    Alcohol/week: 0.0 standard drinks  . Drug use: No  . Sexual activity: Not on file  Other Topics Concern  . Not on file  Social History Narrative  . Not on file   Social Determinants of Health   Financial Resource Strain: Not on file  Food Insecurity: Not on file  Transportation Needs: Not on file  Physical  Activity: Not on file  Stress: Not on file  Social Connections: Not on file  Intimate Partner Violence: Not on file     Current Outpatient Medications:  .  amLODipine (NORVASC) 10 MG tablet, Take 1 tablet (10 mg total) by mouth daily., Disp: 30 tablet, Rfl: 8 .  aspirin EC 81 MG tablet, Take 81 mg by mouth daily. , Disp: , Rfl:  .  budesonide-formoterol (SYMBICORT) 80-4.5 MCG/ACT inhaler, Inhale 2 puffs into the lungs 2 (two) times daily., Disp: 10.2 g, Rfl: 3 .  esomeprazole (NEXIUM) 40 MG capsule, Take 40 mg by mouth daily. , Disp: , Rfl:  .  glipiZIDE (GLUCOTROL XL) 10 MG 24 hr tablet, Take 1 tablet by mouth daily., Disp: , Rfl: 5 .  insulin detemir (LEVEMIR FLEXTOUCH) 100 UNIT/ML FlexPen, Inject 70 Units into the skin daily., Disp: 15 mL, Rfl: 3 .  Insulin Pen Needle (PEN NEEDLES) 31G X 8 MM MISC, USE TO INJECT INSULIN AS DIRECTED, Disp: 100 each, Rfl: 3 .  losartan (COZAAR) 100 MG tablet, Take 1 tablet (100 mg total) by mouth daily., Disp: 90 tablet, Rfl: 3 .  lovastatin (MEVACOR) 20 MG tablet, TAKE 1 TABLET BY MOUTH EVERY DAY, Disp: 90 tablet, Rfl: 1 .  promethazine (PHENERGAN) 25 MG tablet, Take 25 mg by mouth every 6 (six) hours as needed for  nausea or vomiting., Disp: , Rfl:  .  valsartan-hydrochlorothiazide (DIOVAN-HCT) 160-25 MG tablet, Take 1 tablet by mouth daily., Disp: , Rfl: 5 .  albuterol (PROAIR HFA) 108 (90 Base) MCG/ACT inhaler, Inhale 2 puffs into the lungs every 6 (six) hours as needed., Disp: , Rfl:  .  cloNIDine (CATAPRES) 0.1 MG tablet, Take 1 tablet (0.1 mg total) by mouth daily as needed (Blood pressure greater than 180 systolic)., Disp: 30 tablet, Rfl: 0 .  metFORMIN (GLUCOPHAGE-XR) 500 MG 24 hr tablet, Take 1 tablet (500 mg total) by mouth daily., Disp: 90 tablet, Rfl: 3 .  traMADol (ULTRAM) 50 MG tablet, Take 1 tablet (50 mg total) by mouth every 8 (eight) hours as needed., Disp: 60 tablet, Rfl: 0   No Known Allergies  ROS Review of Systems  Constitutional:  Negative.   HENT: Negative.  Negative for facial swelling and sinus pressure.   Eyes: Negative.   Respiratory: Negative.  Negative for cough.   Cardiovascular: Positive for leg swelling.  Gastrointestinal: Positive for nausea. Negative for blood in stool.  Endocrine: Negative.   Genitourinary: Negative.  Negative for dysuria and flank pain.  Musculoskeletal: Positive for arthralgias, joint swelling and myalgias.  Skin: Negative.  Negative for color change.  Allergic/Immunologic: Negative.   Neurological: Negative.   Hematological: Negative.   Psychiatric/Behavioral: Negative.   All other systems reviewed and are negative.     Objective:    Physical Exam Vitals reviewed.  Constitutional:      Appearance: Normal appearance.  HENT:     Mouth/Throat:     Mouth: Mucous membranes are moist.  Eyes:     Pupils: Pupils are equal, round, and reactive to light.  Neck:     Vascular: No carotid bruit.  Cardiovascular:     Rate and Rhythm: Normal rate and regular rhythm.     Pulses: Normal pulses.     Heart sounds: Normal heart sounds.  Pulmonary:     Effort: Pulmonary effort is normal.     Breath sounds: Normal breath sounds.  Abdominal:     General: Bowel sounds are normal.     Palpations: Abdomen is soft. There is no hepatomegaly, splenomegaly or mass.     Tenderness: There is no abdominal tenderness.     Hernia: No hernia is present.  Musculoskeletal:        General: No tenderness.     Cervical back: Neck supple.     Right lower leg: No edema.     Left lower leg: No edema.  Skin:    Findings: No rash.  Neurological:     Mental Status: She is alert and oriented to person, place, and time.     Motor: No weakness.  Psychiatric:        Mood and Affect: Mood and affect normal.        Behavior: Behavior normal.     BP (!) 159/71   Pulse 65   Ht 5\' 4"  (1.626 m)   Wt 208 lb 12.8 oz (94.7 kg)   BMI 35.84 kg/m  Wt Readings from Last 3 Encounters:  07/31/20 208 lb 12.8 oz  (94.7 kg)  05/16/20 213 lb 6.4 oz (96.8 kg)  04/19/20 235 lb 0.2 oz (106.6 kg)     Health Maintenance Due  Topic Date Due  . Hepatitis C Screening  Never done  . FOOT EXAM  Never done  . OPHTHALMOLOGY EXAM  Never done  . COVID-19 Vaccine (1) Never done  . TETANUS/TDAP  Never done  . DEXA SCAN  Never done  . PNA vac Low Risk Adult (1 of 2 - PCV13) Never done  . HEMOGLOBIN A1C  02/25/2012    There are no preventive care reminders to display for this patient.  Lab Results  Component Value Date   TSH 2.62 08/26/2011   Lab Results  Component Value Date   WBC 5.6 07/01/2019   HGB 14.1 07/01/2019   HCT 42.0 07/01/2019   MCV 79.8 (L) 07/01/2019   PLT 186 07/01/2019   Lab Results  Component Value Date   NA 133 (L) 07/01/2019   K 3.5 07/01/2019   CO2 21 (L) 07/01/2019   GLUCOSE 298 (H) 07/01/2019   BUN 12 07/01/2019   CREATININE 0.58 07/01/2019   BILITOT 0.3 08/18/2013   ALKPHOS 103 08/18/2013   AST 20 08/18/2013   ALT 18 08/18/2013   PROT 7.4 08/18/2013   ALBUMIN 3.5 08/18/2013   CALCIUM 8.7 (L) 07/01/2019   ANIONGAP 16 (H) 07/01/2019   Lab Results  Component Value Date   CHOL 182 08/27/2011   Lab Results  Component Value Date   HDL 37 (L) 08/27/2011   Lab Results  Component Value Date   LDLCALC 124 (H) 08/27/2011   Lab Results  Component Value Date   TRIG 105 08/27/2011   No results found for: CHOLHDL Lab Results  Component Value Date   HGBA1C 9.1 (H) 08/28/2011      Assessment & Plan:   Problem List Items Addressed This Visit      Cardiovascular and Mediastinum   Primary hypertension - Primary    - Today, the patient's blood pressure is well managed . - The patient will continue the current treatment regimen.  - I encouraged the patient to eat a low-sodium diet to help control blood pressure. - I encouraged the patient to live an active lifestyle and complete activities that increases heart rate to 85% target heart rate at least 5 times per  week for one hour.            Digestive   GERD (gastroesophageal reflux disease)    - The patient's GERD is stable on medication.  - Instructed the patient to avoid eating spicy and acidic foods, as well as foods high in fat. - Instructed the patient to avoid eating large meals or meals 2-3 hours prior to sleeping.         Endocrine   Type 2 diabetes mellitus with other specified complication (HCC)    - The patient's blood sugar is under control on med - The patient will continue the current treatment regimen.  - I encouraged the patient to regularly check blood sugar.  - I encouraged the patient to monitor diet. I encouraged the patient to eat low-carb and low-sugar to help prevent blood sugar spikes.  - I encouraged the patient to continue following their prescribed treatment plan for diabetes - I informed the patient to get help if blood sugar drops below 54mg /dL, or if suddenly have trouble thinking clearly or breathing.          Relevant Medications   metFORMIN (GLUCOPHAGE-XR) 500 MG 24 hr tablet     Musculoskeletal and Integument   Osteoarthritis of left knee    Lose wt   walk daily controll diabetes       Relevant Medications   traMADol (ULTRAM) 50 MG tablet     Other   Preventative health care    Patient came in for  general checkup she denies chest pain or shortness of breath HEENT examination is normal neck is supple jugular venous pressure is not elevated carotid upstroke is 2+ without any bruit.  On examination of the cardiovascular system was found to have grade 1 systolic murmur chest is clear  abdominal obese soft nontender  there is 1+ pedal edema of the right leg with no pedal edema of the left leg.       Other Visit Diagnoses    Annual physical exam          Meds ordered this encounter  Medications  . DISCONTD: traMADol (ULTRAM) 50 MG tablet    Sig: Take 1 tablet (50 mg total) by mouth every 8 (eight) hours as needed.    Dispense:  60  tablet    Refill:  0    Not to exceed 5 additional fills before 11/18/2020  . DISCONTD: metFORMIN (GLUCOPHAGE-XR) 500 MG 24 hr tablet    Sig: Take 1 tablet (500 mg total) by mouth daily.    Dispense:  90 tablet    Refill:  3  . metFORMIN (GLUCOPHAGE-XR) 500 MG 24 hr tablet    Sig: Take 1 tablet (500 mg total) by mouth daily.    Dispense:  90 tablet    Refill:  3  . traMADol (ULTRAM) 50 MG tablet    Sig: Take 1 tablet (50 mg total) by mouth every 8 (eight) hours as needed.    Dispense:  60 tablet    Refill:  0    Not to exceed 5 additional fills before 11/18/2020    Follow-up: No follow-ups on file.    Corky Downs, MD

## 2020-07-31 NOTE — Assessment & Plan Note (Signed)

## 2020-07-31 NOTE — Assessment & Plan Note (Signed)
Patient came in for general checkup she denies chest pain or shortness of breath HEENT examination is normal neck is supple jugular venous pressure is not elevated carotid upstroke is 2+ without any bruit.  On examination of the cardiovascular system was found to have grade 1 systolic murmur chest is clear  abdominal obese soft nontender  there is 1+ pedal edema of the right leg with no pedal edema of the left leg.

## 2020-07-31 NOTE — Assessment & Plan Note (Signed)
Lose wt   walk daily controll diabetes

## 2020-07-31 NOTE — Assessment & Plan Note (Signed)
-   Today, the patient's blood pressure is well managed . - The patient will continue the current treatment regimen.  - I encouraged the patient to eat a low-sodium diet to help control blood pressure. - I encouraged the patient to live an active lifestyle and complete activities that increases heart rate to 85% target heart rate at least 5 times per week for one hour.     

## 2020-10-15 ENCOUNTER — Other Ambulatory Visit: Payer: Self-pay | Admitting: Internal Medicine

## 2020-10-15 DIAGNOSIS — E119 Type 2 diabetes mellitus without complications: Secondary | ICD-10-CM

## 2020-10-22 ENCOUNTER — Other Ambulatory Visit: Payer: Self-pay | Admitting: *Deleted

## 2020-10-22 MED ORDER — AMLODIPINE BESYLATE 10 MG PO TABS: 10.0000 mg | ORAL_TABLET | Freq: Every day | ORAL | 3 refills | Status: DC

## 2020-10-30 ENCOUNTER — Encounter: Payer: Self-pay | Admitting: Internal Medicine

## 2020-10-30 ENCOUNTER — Other Ambulatory Visit: Payer: Self-pay

## 2020-10-30 ENCOUNTER — Ambulatory Visit (INDEPENDENT_AMBULATORY_CARE_PROVIDER_SITE_OTHER): Payer: Medicare Other | Admitting: Internal Medicine

## 2020-10-30 VITALS — BP 140/80 | HR 72 | Ht 64.0 in | Wt 205.0 lb

## 2020-10-30 DIAGNOSIS — M1712 Unilateral primary osteoarthritis, left knee: Secondary | ICD-10-CM

## 2020-10-30 DIAGNOSIS — I10 Essential (primary) hypertension: Secondary | ICD-10-CM

## 2020-10-30 DIAGNOSIS — K219 Gastro-esophageal reflux disease without esophagitis: Secondary | ICD-10-CM

## 2020-10-30 DIAGNOSIS — E669 Obesity, unspecified: Secondary | ICD-10-CM | POA: Diagnosis not present

## 2020-10-30 DIAGNOSIS — E1169 Type 2 diabetes mellitus with other specified complication: Secondary | ICD-10-CM

## 2020-10-30 MED ORDER — TRAMADOL HCL 50 MG PO TABS: 50.0000 mg | ORAL_TABLET | Freq: Three times a day (TID) | ORAL | 0 refills | Status: DC | PRN

## 2020-10-30 NOTE — Assessment & Plan Note (Signed)
-   The patient's blood sugar is not under control on med. - The patient will continue the current treatment regimen.  Lose wt  , follow her diet - I encouraged the patient to regularly check blood sugar.  - I encouraged the patient to monitor diet. I encouraged the patient to eat low-carb and low-sugar to help prevent blood sugar spikes.  - I encouraged the patient to continue following their prescribed treatment plan for diabetes - I informed the patient to get help if blood sugar drops below 54mg /dL, or if suddenly have trouble thinking clearly or breathing.

## 2020-10-30 NOTE — Assessment & Plan Note (Signed)
-   I encouraged the patient to lose weight.  - I educated them on making healthy dietary choices including eating more fruits and vegetables and less fried foods. - I encouraged the patient to exercise more, and educated on the benefits of exercise including weight loss, diabetes prevention, and hypertension prevention.   

## 2020-10-30 NOTE — Assessment & Plan Note (Signed)
-   The patient's GERD is stable on medication.  - Instructed the patient to avoid eating spicy and acidic foods, as well as foods high in fat. - Instructed the patient to avoid eating large meals or meals 2-3 hours prior to sleeping. 

## 2020-10-30 NOTE — Assessment & Plan Note (Signed)

## 2020-10-30 NOTE — Assessment & Plan Note (Addendum)
Patient was advised to lose weight.  She was given tramadol 1 tab po tid a day as needed

## 2020-10-30 NOTE — Progress Notes (Signed)
Established Patient Office Visit  Subjective:  Patient ID: Mary Hodges Paczkowski, female    DOB: 12/28/1940  Age: 80 y.o. MRN: [id number]  CC:  Chief Complaint  Patient presents with  . Hypertension  . Osteoarthritis    Patient needs refill of tramadol   . Diabetes    Patient's blood sugar was 139    HPI  Mary Hodges Hinkson presents for Hodges general checkup.  She complains of pain in the right knee and swelling of the both legs.  This problem is exogenous obesity.  Patient is known to have hypertension reflux esophagitis diabetes type 2 with no history of recent syncope.  Past Medical History:  Diagnosis Date  . Arthritis   . Asthma   . GERD (gastroesophageal reflux disease)   . HLD (hyperlipidemia)   . HTN (hypertension)   . Hypercholesteremia   . Hypertension   . Type 2 diabetes mellitus (HCC)     Past Surgical History:  Procedure Laterality Date  . CATARACT EXTRACTION W/PHACO Right 01/19/2020   Procedure: CATARACT EXTRACTION PHACO AND INTRAOCULAR LENS PLACEMENT (IOC) RIGHT DIABETIC;  Surgeon: Galen ManilaPorfilio, William, MD;  Location: [facility] OR[facility];  Service: Ophthalmology;  Laterality: Right;  US1:18.4 CDE9.08 LOT [id number] H  . CATARACT EXTRACTION W/PHACO Left 04/19/2020   Procedure: CATARACT EXTRACTION PHACO AND INTRAOCULAR LENS PLACEMENT (IOC) LEFT DIABETIC;  Surgeon: Galen ManilaPorfilio, William, MD;  [facility];  Service: Ophthalmology;  Laterality: Left;  8.48 0:49.6  . left toe surgery      Family History  Problem Relation Age of Onset  . CAD Father   . CVA Mother     Social History   Socioeconomic History  . Marital status: Widowed    Spouse name: Not on file  . Number of children: Not on file  . Years of education: Not on file  . Highest education level: Not on file  Occupational History  . Not on file  Tobacco Use  . Smoking status: Never Smoker  . Smokeless tobacco: Never Used  Vaping Use  . Vaping Use: Never used  Substance and Sexual Activity  . Alcohol use: No     Alcohol/week: 0.0 standard drinks  . Drug use: No  . Sexual activity: Not on file  Other Topics Concern  . Not on file  Social History Narrative  . Not on file   Social Determinants of Health   Financial Resource Strain: Not on file  Food Insecurity: Not on file  Transportation Needs: Not on file  Physical Activity: Not on file  Stress: Not on file  Social Connections: Not on file  Intimate Partner Violence: Not on file     Current Outpatient Medications:  .  amLODipine (NORVASC) 10 MG tablet, Take 1 tablet (10 mg total) by mouth daily., Disp: 90 tablet, Rfl: 3 .  aspirin EC 81 MG tablet, Take 81 mg by mouth daily. , Disp: , Rfl:  .  budesonide-formoterol (SYMBICORT) 80-4.5 MCG/ACT inhaler, Inhale 2 puffs into the lungs 2 (two) times daily., Disp: 10.2 g, Rfl: 3 .  esomeprazole (NEXIUM) 40 MG capsule, Take 40 mg by mouth daily. , Disp: , Rfl:  .  glipiZIDE (GLUCOTROL XL) 10 MG 24 hr tablet, Take 1 tablet by mouth daily., Disp: , Rfl: 5 .  Insulin Pen Needle (PEN NEEDLES) 31G X 8 MM MISC, USE TO INJECT INSULIN AS DIRECTED, Disp: 100 each, Rfl: 3 .  LEVEMIR FLEXTOUCH 100 UNIT/ML FlexPen, INJECT 70 UNITS INTO THE SKIN DAILY., Disp: 3 mL,  Rfl: 6 .  losartan (COZAAR) 100 MG tablet, Take 1 tablet (100 mg total) by mouth daily., Disp: 90 tablet, Rfl: 3 .  lovastatin (MEVACOR) 20 MG tablet, TAKE 1 TABLET BY MOUTH EVERY DAY, Disp: 90 tablet, Rfl: 1 .  metFORMIN (GLUCOPHAGE-XR) 500 MG 24 hr tablet, Take 1 tablet (500 mg total) by mouth daily., Disp: 90 tablet, Rfl: 3 .  promethazine (PHENERGAN) 25 MG tablet, Take 25 mg by mouth every 6 (six) hours as needed for nausea or vomiting., Disp: , Rfl:  .  valsartan-hydrochlorothiazide (DIOVAN-HCT) 160-25 MG tablet, Take 1 tablet by mouth daily., Disp: , Rfl: 5 .  albuterol (PROAIR HFA) 108 (90 Base) MCG/ACT inhaler, Inhale 2 puffs into the lungs every 6 (six) hours as needed., Disp: , Rfl:  .  cloNIDine (CATAPRES) 0.1 MG tablet, Take 1 tablet  (0.1 mg total) by mouth daily as needed (Blood pressure greater than 180 systolic)., Disp: 30 tablet, Rfl: 0 .  traMADol (ULTRAM) 50 MG tablet, Take 1 tablet (50 mg total) by mouth every 8 (eight) hours as needed., Disp: 60 tablet, Rfl: 0   No Known Allergies  ROS Review of Systems  Constitutional: Negative.  Negative for fatigue.  HENT: Negative.   Eyes: Negative.   Respiratory: Negative.  Negative for chest tightness and shortness of breath.   Cardiovascular: Negative.   Gastrointestinal: Negative.   Endocrine: Negative.  Negative for polyphagia.  Genitourinary: Negative.  Negative for dysuria.  Musculoskeletal: Negative.   Skin: Negative.   Allergic/Immunologic: Negative.   Neurological: Negative.  Negative for light-headedness.  Hematological: Negative.   Psychiatric/Behavioral: Negative.  Negative for behavioral problems.  All other systems reviewed and are negative.     Objective:    Physical Exam Vitals reviewed.  Constitutional:      Appearance: Normal appearance. She is obese.  HENT:     Head: Atraumatic.     Mouth/Throat:     Mouth: Mucous membranes are moist.  Eyes:     Pupils: Pupils are equal, round, and reactive to light.  Neck:     Vascular: No carotid bruit.  Cardiovascular:     Rate and Rhythm: Normal rate and regular rhythm.     Pulses: Normal pulses.     Heart sounds: Normal heart sounds. No murmur heard.   Pulmonary:     Effort: Pulmonary effort is normal.     Breath sounds: Normal breath sounds.  Abdominal:     General: Bowel sounds are normal. There is distension.     Palpations: Abdomen is soft. There is no hepatomegaly, splenomegaly or mass.     Tenderness: There is no abdominal tenderness.     Hernia: No hernia is present.  Musculoskeletal:        General: No tenderness.     Cervical back: Neck supple.     Right lower leg: No edema.     Left lower leg: No edema.  Skin:    Findings: No rash.  Neurological:     General: No focal  deficit present.     Mental Status: She is alert and oriented to person, place, and time.     Motor: No weakness.  Psychiatric:        Mood and Affect: Mood and affect normal.        Behavior: Behavior normal.     BP 140/80   Pulse 72   Ht 5\' 4"  (1.626 m)   Wt 205 lb (93 kg)   BMI 35.19 kg/m  Wt Readings from Last 3 Encounters:  10/30/20 205 lb (93 kg)  07/31/20 208 lb 12.8 oz (94.7 kg)  05/16/20 213 lb 6.4 oz (96.8 kg)     Health Maintenance Due  Topic Date Due  . Hepatitis C Screening  Never done  . COVID-19 Vaccine (1) Never done  . FOOT EXAM  Never done  . OPHTHALMOLOGY EXAM  Never done  . TETANUS/TDAP  Never done  . DEXA SCAN  Never done  . PNA vac Low Risk Adult (1 of 2 - PCV13) Never done  . HEMOGLOBIN A1C  02/25/2012    There are no preventive care reminders to display for this patient.  Lab Results  Component Value Date   TSH 2.62 08/26/2011   Lab Results  Component Value Date   WBC 5.6 07/01/2019   HGB 14.1 07/01/2019   HCT 42.0 07/01/2019   MCV 79.8 (L) 07/01/2019   PLT 186 07/01/2019   Lab Results  Component Value Date   NA 133 (L) 07/01/2019   K 3.5 07/01/2019   CO2 21 (L) 07/01/2019   GLUCOSE 298 (H) 07/01/2019   BUN 12 07/01/2019   CREATININE 0.58 07/01/2019   BILITOT 0.3 08/18/2013   ALKPHOS 103 08/18/2013   AST 20 08/18/2013   ALT 18 08/18/2013   PROT 7.4 08/18/2013   ALBUMIN 3.5 08/18/2013   CALCIUM 8.7 (L) 07/01/2019   ANIONGAP 16 (H) 07/01/2019   Lab Results  Component Value Date   CHOL 182 08/27/2011   Lab Results  Component Value Date   HDL 37 (L) 08/27/2011   Lab Results  Component Value Date   LDLCALC 124 (H) 08/27/2011   Lab Results  Component Value Date   TRIG 105 08/27/2011   No results found for: CHOLHDL Lab Results  Component Value Date   HGBA1C 9.1 (H) 08/28/2011      Assessment & Plan:   Problem List Items Addressed This Visit      Cardiovascular and Mediastinum   Primary hypertension     Patient blood pressure is normal patient denies any chest pain or shortness of breath there is no history of palpitation paroxysmal nocturnal dyspnea patient can walkioo yards without any problem patient was advised to follow low-salt low-cholesterol diet  I reviewed the results of Sprint trial  ideally I want to keep systolic blood pressure below 390 mmHg, patient was asked to check blood pressure 3 times Hodges week and give me Hodges report on that.  Patient will be follow-up in 3 months, patient will call me back for any change in the cardiovascular symptoms           Digestive   GERD (gastroesophageal reflux disease) - Primary    - The patient's GERD is stable on medication.  - Instructed the patient to avoid eating spicy and acidic foods, as well as foods high in fat. - Instructed the patient to avoid eating large meals or meals 2-3 hours prior to sleeping.        Endocrine   Type 2 diabetes mellitus with other specified complication (HCC)    - The patient's blood sugar is not under control on med. - The patient will continue the current treatment regimen.  Lose wt  , follow her diet - I encouraged the patient to regularly check blood sugar.  - I encouraged the patient to monitor diet. I encouraged the patient to eat low-carb and low-sugar to help prevent blood sugar spikes.  - I encouraged the patient  to continue following their prescribed treatment plan for diabetes - I informed the patient to get help if blood sugar drops below 54mg /dL, or if suddenly have trouble thinking clearly or breathing.         Musculoskeletal and Integument   Osteoarthritis of left knee    Patient was advised to lose weight.  She was given tramadol 1 tab po tid Hodges day as needed      Relevant Medications   traMADol (ULTRAM) 50 MG tablet     Other   Obesity (BMI 35.0-39.9 without comorbidity)    - I encouraged the patient to lose weight.  - I educated them on making healthy dietary choices including eating  more fruits and vegetables and less fried foods. - I encouraged the patient to exercise more, and educated on the benefits of exercise including weight loss, diabetes prevention, and hypertension prevention.           Meds ordered this encounter  Medications  . traMADol (ULTRAM) 50 MG tablet    Sig: Take 1 tablet (50 mg total) by mouth every 8 (eight) hours as needed.    Dispense:  60 tablet    Refill:  0    Not to exceed 5 additional fills before 11/18/2020    Follow-up: No follow-ups on file.    01/18/2021, MD

## 2020-11-28 ENCOUNTER — Ambulatory Visit: Payer: Medicare Other | Admitting: Internal Medicine

## 2020-11-29 ENCOUNTER — Ambulatory Visit (INDEPENDENT_AMBULATORY_CARE_PROVIDER_SITE_OTHER): Payer: Medicare Other | Admitting: Internal Medicine

## 2020-11-29 ENCOUNTER — Other Ambulatory Visit: Payer: Self-pay

## 2020-11-29 VITALS — BP 155/80 | HR 62 | Ht 64.0 in | Wt 203.5 lb

## 2020-11-29 DIAGNOSIS — E1169 Type 2 diabetes mellitus with other specified complication: Secondary | ICD-10-CM | POA: Diagnosis not present

## 2020-11-29 DIAGNOSIS — M1712 Unilateral primary osteoarthritis, left knee: Secondary | ICD-10-CM | POA: Diagnosis not present

## 2020-11-29 DIAGNOSIS — I10 Essential (primary) hypertension: Secondary | ICD-10-CM | POA: Diagnosis not present

## 2020-11-29 DIAGNOSIS — E782 Mixed hyperlipidemia: Secondary | ICD-10-CM

## 2020-11-29 DIAGNOSIS — K219 Gastro-esophageal reflux disease without esophagitis: Secondary | ICD-10-CM

## 2020-11-29 DIAGNOSIS — E669 Obesity, unspecified: Secondary | ICD-10-CM | POA: Diagnosis not present

## 2020-11-29 MED ORDER — TRAMADOL HCL 50 MG PO TABS: 50.0000 mg | ORAL_TABLET | Freq: Three times a day (TID) | ORAL | 0 refills | Status: AC | PRN

## 2020-11-29 NOTE — Progress Notes (Signed)
Established Patient Office Visit  Subjective:  Patient ID: Mary Hodges, female    DOB: 06/09/1941  Age: 80 y.o. MRN: [id number]  CC:  Chief Complaint  Patient presents with  . Follow-up    HPI  Mary Hodges presents forpain in both knees  Past Medical History:  Diagnosis Date  . Arthritis   . Asthma   . GERD (gastroesophageal reflux disease)   . HLD (hyperlipidemia)   . HTN (hypertension)   . Hypercholesteremia   . Hypertension   . Type 2 diabetes mellitus (HCC)     Past Surgical History:  Procedure Laterality Date  . CATARACT EXTRACTION W/PHACO Right 01/19/2020   Procedure: CATARACT EXTRACTION PHACO AND INTRAOCULAR LENS PLACEMENT (IOC) RIGHT DIABETIC;  Surgeon: Galen Manila, MD;  Location: [facility];  Service: Ophthalmology;  Laterality: Right;  US1:18.4 CDE9.08 LOT [id number] H  . CATARACT EXTRACTION W/PHACO Left 04/19/2020   Procedure: CATARACT EXTRACTION PHACO AND INTRAOCULAR LENS PLACEMENT (IOC) LEFT DIABETIC;  Surgeon: Galen Manila, MD;  Location: [facility];  Service: Ophthalmology;  Laterality: Left;  8.48 0:49.6  . left toe surgery      Family History  Problem Relation Age of Onset  . CAD Father   . CVA Mother     Social History   Socioeconomic History  . Marital status: Widowed    Spouse name: Not on file  . Number of children: Not on file  . Years of education: Not on file  . Highest education level: Not on file  Occupational History  . Not on file  Tobacco Use  . Smoking status: Never Smoker  . Smokeless tobacco: Never Used  Vaping Use  . Vaping Use: Never used  Substance and Sexual Activity  . Alcohol use: No    Alcohol/week: 0.0 standard drinks  . Drug use: No  . Sexual activity: Not on file  Other Topics Concern  . Not on file  Social History Narrative  . Not on file   Social Determinants of Health   Financial Resource Strain: Not on file  Food Insecurity: Not on file  Transportation Needs: Not on file  Physical  Activity: Not on file  Stress: Not on file  Social Connections: Not on file  Intimate Partner Violence: Not on file     Current Outpatient Medications:  .  albuterol (PROAIR HFA) 108 (90 Base) MCG/ACT inhaler, Inhale 2 puffs into the lungs every 6 (six) hours as needed., Disp: , Rfl:  .  amLODipine (NORVASC) 10 MG tablet, Take 1 tablet (10 mg total) by mouth daily., Disp: 90 tablet, Rfl: 3 .  aspirin EC 81 MG tablet, Take 81 mg by mouth daily. , Disp: , Rfl:  .  budesonide-formoterol (SYMBICORT) 80-4.5 MCG/ACT inhaler, Inhale 2 puffs into the lungs 2 (two) times daily., Disp: 10.2 g, Rfl: 3 .  cloNIDine (CATAPRES) 0.1 MG tablet, Take 1 tablet (0.1 mg total) by mouth daily as needed (Blood pressure greater than 180 systolic)., Disp: 30 tablet, Rfl: 0 .  esomeprazole (NEXIUM) 40 MG capsule, Take 40 mg by mouth daily. , Disp: , Rfl:  .  glipiZIDE (GLUCOTROL XL) 10 MG 24 hr tablet, Take 1 tablet by mouth daily., Disp: , Rfl: 5 .  Insulin Pen Needle (PEN NEEDLES) 31G X 8 MM MISC, USE TO INJECT INSULIN AS DIRECTED, Disp: 100 each, Rfl: 3 .  LEVEMIR FLEXTOUCH 100 UNIT/ML FlexPen, INJECT 70 UNITS INTO THE SKIN DAILY., Disp: 3 mL, Rfl: 6 .  losartan (COZAAR) 100  MG tablet, Take 1 tablet (100 mg total) by mouth daily., Disp: 90 tablet, Rfl: 3 .  lovastatin (MEVACOR) 20 MG tablet, TAKE 1 TABLET BY MOUTH EVERY DAY, Disp: 90 tablet, Rfl: 1 .  metFORMIN (GLUCOPHAGE-XR) 500 MG 24 hr tablet, Take 1 tablet (500 mg total) by mouth daily., Disp: 90 tablet, Rfl: 3 .  promethazine (PHENERGAN) 25 MG tablet, Take 25 mg by mouth every 6 (six) hours as needed for nausea or vomiting., Disp: , Rfl:  .  traMADol (ULTRAM) 50 MG tablet, Take 1 tablet (50 mg total) by mouth every 8 (eight) hours as needed., Disp: 60 tablet, Rfl: 0 .  valsartan-hydrochlorothiazide (DIOVAN-HCT) 160-25 MG tablet, Take 1 tablet by mouth daily., Disp: , Rfl: 5   No Known Allergies  ROS Review of Systems  Constitutional: Negative.   HENT:  Negative.   Eyes: Negative.   Respiratory: Negative.   Cardiovascular: Negative.   Gastrointestinal: Negative.   Endocrine: Negative.   Genitourinary: Negative.   Musculoskeletal: Positive for arthralgias, back pain, gait problem and joint swelling.  Skin: Negative.   Allergic/Immunologic: Negative.   Neurological: Negative for seizures and headaches.  Hematological: Negative.   Psychiatric/Behavioral: Negative.  Negative for agitation and behavioral problems.  All other systems reviewed and are negative. Complain of pain in the both knees.    Objective:    Physical Exam Vitals reviewed.  Constitutional:      Appearance: Normal appearance.  HENT:     Mouth/Throat:     Mouth: Mucous membranes are moist.  Eyes:     Pupils: Pupils are equal, round, and reactive to light.  Neck:     Vascular: No carotid bruit.  Cardiovascular:     Rate and Rhythm: Normal rate and regular rhythm.     Pulses: Normal pulses.     Heart sounds: Normal heart sounds.  Pulmonary:     Effort: Pulmonary effort is normal.     Breath sounds: Normal breath sounds.  Abdominal:     General: Bowel sounds are normal.     Palpations: Abdomen is soft. There is no hepatomegaly, splenomegaly or mass.     Tenderness: There is no abdominal tenderness.     Hernia: No hernia is present.  Musculoskeletal:        General: Swelling present. No tenderness.     Cervical back: Neck supple.     Right lower leg: No edema.     Left lower leg: No edema.     Comments: Tenderness on the both knees.  Skin:    Findings: No rash.  Neurological:     Mental Status: She is alert and oriented to person, place, and time.     Motor: No weakness.  Psychiatric:        Mood and Affect: Mood and affect normal.        Behavior: Behavior normal.     BP (!) 155/80   Pulse 62   Ht 5\' 4"  (1.626 m)   Wt 203 lb 8 oz (92.3 kg)   BMI 34.93 kg/m  Wt Readings from Last 3 Encounters:  11/29/20 203 lb 8 oz (92.3 kg)  10/30/20 205 lb  (93 kg)  07/31/20 208 lb 12.8 oz (94.7 kg)     Health Maintenance Due  Topic Date Due  . Hepatitis C Screening  Never done  . COVID-19 Vaccine (1) Never done  . FOOT EXAM  Never done  . OPHTHALMOLOGY EXAM  Never done  . TETANUS/TDAP  Never done  .  DEXA SCAN  Never done  . PNA vac Low Risk Adult (1 of 2 - PCV13) Never done  . HEMOGLOBIN A1C  02/25/2012    There are no preventive care reminders to display for this patient.  Lab Results  Component Value Date   TSH 2.62 08/26/2011   Lab Results  Component Value Date   WBC 5.6 07/01/2019   HGB 14.1 07/01/2019   HCT 42.0 07/01/2019   MCV 79.8 (L) 07/01/2019   PLT 186 07/01/2019   Lab Results  Component Value Date   NA 133 (L) 07/01/2019   K 3.5 07/01/2019   CO2 21 (L) 07/01/2019   GLUCOSE 298 (H) 07/01/2019   BUN 12 07/01/2019   CREATININE 0.58 07/01/2019   BILITOT 0.3 08/18/2013   ALKPHOS 103 08/18/2013   AST 20 08/18/2013   ALT 18 08/18/2013   PROT 7.4 08/18/2013   ALBUMIN 3.5 08/18/2013   CALCIUM 8.7 (L) 07/01/2019   ANIONGAP 16 (H) 07/01/2019   Lab Results  Component Value Date   CHOL 182 08/27/2011   Lab Results  Component Value Date   HDL 37 (L) 08/27/2011   Lab Results  Component Value Date   LDLCALC 124 (H) 08/27/2011   Lab Results  Component Value Date   TRIG 105 08/27/2011   No results found for: CHOLHDL Lab Results  Component Value Date   HGBA1C 9.1 (H) 08/28/2011      Assessment & Plan:   Problem List Items Addressed This Visit      Cardiovascular and Mediastinum   Primary hypertension - Primary    Patient blood pressure isabnormal patient denies any chest pain or shortness of breath there is no history of palpitation or paroxysmal nocturnal dyspnea   patient was advised to follow low-salt low-cholesterol diet    ideally I want to keep systolic blood pressure below 161130 mmHg, patient was asked to check blood pressure one times a week and give me a report on that.  Patient will be  follow-up in 3 months  or earlier as needed, patient will call me back for any change in the cardiovascular symptoms           Digestive   Gastroesophageal reflux disease without esophagitis    - The patient's GERD is stable on medication.  - Instructed the patient to avoid eating spicy and acidic foods, as well as foods high in fat. - Instructed the patient to avoid eating large meals or meals 2-3 hours prior to sleeping.        Endocrine   Type 2 diabetes mellitus with other specified complication (HCC)    - The patient's blood sugar is under control on med. - The patient will continue the current treatment regimen.  - I encouraged the patient to regularly check blood sugar.  - I encouraged the patient to monitor diet. I encouraged the patient to eat low-carb and low-sugar to help prevent blood sugar spikes.  - I encouraged the patient to continue following their prescribed treatment plan for diabetes - I informed the patient to get help if blood sugar drops below 54mg /dL, or if suddenly have trouble thinking clearly or breathing.         Musculoskeletal and Integument   Osteoarthritis of left knee    Refer to orthopedics      Relevant Medications   traMADol (ULTRAM) 50 MG tablet     Other   HLD (hyperlipidemia)    Hypercholesterolemia  I advised the patient to follow  Mediterranean diet This diet is rich in fruits vegetables and whole grain, and This diet is also rich in fish and lean meat Patient should also eat a handful of almonds or walnuts daily Recent heart study indicated that average follow-up on this kind of diet reduces the cardiovascular mortality by 50 to 70%==      Obesity (BMI 35.0-39.9 without comorbidity)    - I encouraged the patient to lose weight.  - I educated them on making healthy dietary choices including eating more fruits and vegetables and less fried foods. - I encouraged the patient to exercise more, and educated on the benefits of exercise  including weight loss, diabetes prevention, and hypertension prevention.   Dietary counseling with a registered dietician  Referral to a weight management support group (e.g. Weight Watchers, Overeaters Anonymous)  If your BMI is greater than 29 or you have gained more than 15 pounds you should work on weight loss.  Attend a healthy cooking class          Meds ordered this encounter  Medications  . traMADol (ULTRAM) 50 MG tablet    Sig: Take 1 tablet (50 mg total) by mouth every 8 (eight) hours as needed.    Dispense:  60 tablet    Refill:  0    Not to exceed 5 additional fills before 11/18/2020    Follow-up: No follow-ups on file.    Corky Downs, MD

## 2020-12-05 ENCOUNTER — Encounter: Payer: Self-pay | Admitting: Internal Medicine

## 2020-12-05 NOTE — Assessment & Plan Note (Signed)
Refer to orthopedics 

## 2020-12-05 NOTE — Assessment & Plan Note (Signed)

## 2020-12-05 NOTE — Assessment & Plan Note (Signed)
Patient blood pressure is abnormal patient denies any chest pain or shortness of breath there is no history of palpitation or paroxysmal nocturnal dyspnea   patient was advised to follow low-salt low-cholesterol diet    ideally I want to keep systolic blood pressure below 130 mmHg, patient was asked to check blood pressure one times a week and give me a report on that.  Patient will be follow-up in 3 months  or earlier as needed, patient will call me back for any change in the cardiovascular symptoms    

## 2020-12-05 NOTE — Assessment & Plan Note (Signed)
Hypercholesterolemia  I advised the patient to follow Mediterranean diet This diet is rich in fruits vegetables and whole grain, and This diet is also rich in fish and lean meat Patient should also eat a handful of almonds or walnuts daily Recent heart study indicated that average follow-up on this kind of diet reduces the cardiovascular mortality by 50 to 70%== 

## 2020-12-05 NOTE — Assessment & Plan Note (Signed)
-   The patient's GERD is stable on medication.  - Instructed the patient to avoid eating spicy and acidic foods, as well as foods high in fat. - Instructed the patient to avoid eating large meals or meals 2-3 hours prior to sleeping. 

## 2020-12-05 NOTE — Assessment & Plan Note (Signed)

## 2020-12-17 ENCOUNTER — Other Ambulatory Visit: Payer: Self-pay | Admitting: Internal Medicine

## 2020-12-17 DIAGNOSIS — E11610 Type 2 diabetes mellitus with diabetic neuropathic arthropathy: Secondary | ICD-10-CM

## 2020-12-17 DIAGNOSIS — Z1331 Encounter for screening for depression: Secondary | ICD-10-CM

## 2021-01-26 ENCOUNTER — Other Ambulatory Visit: Payer: Self-pay | Admitting: Family Medicine

## 2021-01-26 DIAGNOSIS — Z1331 Encounter for screening for depression: Secondary | ICD-10-CM

## 2021-01-27 ENCOUNTER — Other Ambulatory Visit: Payer: Self-pay | Admitting: Internal Medicine

## 2021-01-29 ENCOUNTER — Other Ambulatory Visit: Payer: Self-pay

## 2021-01-29 ENCOUNTER — Encounter: Payer: Self-pay | Admitting: Internal Medicine

## 2021-01-29 ENCOUNTER — Ambulatory Visit (INDEPENDENT_AMBULATORY_CARE_PROVIDER_SITE_OTHER): Payer: Medicare Other | Admitting: Internal Medicine

## 2021-01-29 VITALS — BP 140/80 | HR 74 | Ht 64.0 in | Wt 201.5 lb

## 2021-01-29 DIAGNOSIS — M1712 Unilateral primary osteoarthritis, left knee: Secondary | ICD-10-CM | POA: Diagnosis not present

## 2021-01-29 DIAGNOSIS — K219 Gastro-esophageal reflux disease without esophagitis: Secondary | ICD-10-CM

## 2021-01-29 DIAGNOSIS — E1169 Type 2 diabetes mellitus with other specified complication: Secondary | ICD-10-CM

## 2021-01-29 DIAGNOSIS — E782 Mixed hyperlipidemia: Secondary | ICD-10-CM

## 2021-01-29 DIAGNOSIS — R300 Dysuria: Secondary | ICD-10-CM

## 2021-01-29 DIAGNOSIS — I10 Essential (primary) hypertension: Secondary | ICD-10-CM

## 2021-01-29 DIAGNOSIS — E669 Obesity, unspecified: Secondary | ICD-10-CM

## 2021-01-29 MED ORDER — TRAMADOL HCL 50 MG PO TABS: 50.0000 mg | ORAL_TABLET | Freq: Two times a day (BID) | ORAL | 0 refills | Status: AC

## 2021-01-29 NOTE — Assessment & Plan Note (Signed)
We will give her tramadol for arthritis of the knee.

## 2021-01-29 NOTE — Assessment & Plan Note (Signed)
-   The patient's GERD is stable on medication.  - Instructed the patient to avoid eating spicy and acidic foods, as well as foods high in fat. - Instructed the patient to avoid eating large meals or meals 2-3 hours prior to sleeping. 

## 2021-01-29 NOTE — Progress Notes (Signed)
Established Patient Office Visit  Subjective:  Patient ID: Mary Hodges, female    DOB: 1941-07-01  Age: 80 y.o. MRN: [id number]  CC:  Chief Complaint  Patient presents with   Hypertension   Urinary Tract Infection    Patient complains of burning during urination.    Hypertension  Urinary Tract Infection    [name] presents for her blood pressure evaluation.  She denies any chest pain or shortness of breath.  She has a problem with obesity diabetes depression was checked today and it was found to be 140/80.  She is also on tramadol for her arthritis.  I discussed the control of diabetes with her in detail.   Past Medical History:  Diagnosis Date   Arthritis    Asthma    GERD (gastroesophageal reflux disease)    HLD (hyperlipidemia)    HTN (hypertension)    Hypercholesteremia    Hypertension    Type 2 diabetes mellitus (HCC)     Past Surgical History:  Procedure Laterality Date   CATARACT EXTRACTION W/PHACO Right 01/19/2020   Procedure: CATARACT EXTRACTION PHACO AND INTRAOCULAR LENS PLACEMENT (IOC) RIGHT DIABETIC;  Surgeon: Galen Manila, MD;  Location: [facility];  Service: Ophthalmology;  Laterality: Right;  US1:18.4 CDE9.08 LOT [id number] H   CATARACT EXTRACTION W/PHACO Left 04/19/2020   Procedure: CATARACT EXTRACTION PHACO AND INTRAOCULAR LENS PLACEMENT (IOC) LEFT DIABETIC;  Surgeon: Galen Manila, MD;  Location: [facility];  Service: Ophthalmology;  Laterality: Left;  8.48 0:49.6   left toe surgery      Family History  Problem Relation Age of Onset   CAD Father    CVA Mother     Social History   Socioeconomic History   Marital status: Widowed    Spouse name: Not on file   Number of children: Not on file   Years of education: Not on file   Highest education level: Not on file  Occupational History   Not on file  Tobacco Use   Smoking status: Never   Smokeless tobacco: Never  Vaping Use   Vaping Use: Never used  Substance and Sexual  Activity   Alcohol use: No    Alcohol/week: 0.0 standard drinks   Drug use: No   Sexual activity: Not on file  Other Topics Concern   Not on file  Social History Narrative   Not on file   Social Determinants of Health   Financial Resource Strain: Not on file  Food Insecurity: Not on file  Transportation Needs: Not on file  Physical Activity: Not on file  Stress: Not on file  Social Connections: Not on file  Intimate Partner Violence: Not on file     Current Outpatient Medications:    amLODipine (NORVASC) 10 MG tablet, Take 1 tablet (10 mg total) by mouth daily., Disp: 90 tablet, Rfl: 3   aspirin EC 81 MG tablet, Take 81 mg by mouth daily. , Disp: , Rfl:    budesonide-formoterol (SYMBICORT) 80-4.5 MCG/ACT inhaler, Inhale 2 puffs into the lungs 2 (two) times daily., Disp: 10.2 g, Rfl: 3   esomeprazole (NEXIUM) 40 MG capsule, Take 40 mg by mouth daily. , Disp: , Rfl:    glipiZIDE (GLUCOTROL XL) 10 MG 24 hr tablet, Take 1 tablet by mouth daily., Disp: , Rfl: 5   Insulin Pen Needle (PEN NEEDLES) 31G X 8 MM MISC, USE TO INJECT INSULIN AS DIRECTED, Disp: 100 each, Rfl: 3   LEVEMIR FLEXTOUCH 100 UNIT/ML FlexPen, INJECT 70 UNITS  INTO THE SKIN DAILY., Disp: 3 mL, Rfl: 6   losartan (COZAAR) 100 MG tablet, Take 1 tablet (100 mg total) by mouth daily., Disp: 90 tablet, Rfl: 3   lovastatin (MEVACOR) 20 MG tablet, TAKE 1 TABLET BY MOUTH EVERY DAY, Disp: 90 tablet, Rfl: 1   metFORMIN (GLUCOPHAGE-XR) 500 MG 24 hr tablet, Take 1 tablet (500 mg total) by mouth daily., Disp: 90 tablet, Rfl: 3   oxybutynin (DITROPAN) 5 MG tablet, TAKE 1 TABLET BY MOUTH EVERY DAY, Disp: 90 tablet, Rfl: 3   promethazine (PHENERGAN) 25 MG tablet, TAKE 1 TABLET BY MOUTH TWICE A DAY, Disp: 30 tablet, Rfl: 2   valsartan-hydrochlorothiazide (DIOVAN-HCT) 160-25 MG tablet, TAKE 1 TABLET BY MOUTH EVERY DAY, Disp: 90 tablet, Rfl: 3   albuterol (PROAIR HFA) 108 (90 Base) MCG/ACT inhaler, Inhale 2 puffs into the lungs every 6  (six) hours as needed., Disp: , Rfl:    cloNIDine (CATAPRES) 0.1 MG tablet, Take 1 tablet (0.1 mg total) by mouth daily as needed (Blood pressure greater than 180 systolic)., Disp: 30 tablet, Rfl: 0   No Known Allergies  ROS Review of Systems  Constitutional: Negative.   HENT: Negative.    Eyes: Negative.   Respiratory: Negative.    Cardiovascular: Negative.   Gastrointestinal: Negative.   Endocrine: Negative.   Genitourinary: Negative.   Skin: Negative.   Allergic/Immunologic: Negative.   Neurological: Negative.   Hematological: Negative.   Psychiatric/Behavioral: Negative.    All other systems reviewed and are negative. Patient has arthritis of knee and back he also is overweight and has trouble walking.   Objective:    Physical Exam Vitals reviewed.  Constitutional:      Appearance: Normal appearance.  HENT:     Mouth/Throat:     Mouth: Mucous membranes are moist.  Eyes:     Pupils: Pupils are equal, round, and reactive to light.  Neck:     Vascular: No carotid bruit.  Cardiovascular:     Rate and Rhythm: Normal rate and regular rhythm.     Pulses: Normal pulses.     Heart sounds: Normal heart sounds.  Pulmonary:     Effort: Pulmonary effort is normal.     Breath sounds: Normal breath sounds.  Abdominal:     General: Bowel sounds are normal.     Palpations: Abdomen is soft. There is no hepatomegaly, splenomegaly or mass.     Tenderness: There is no abdominal tenderness.     Hernia: No hernia is present.  Musculoskeletal:        General: No tenderness.     Cervical back: Neck supple.     Right lower leg: No edema.     Left lower leg: No edema.  Skin:    Findings: No rash.  Neurological:     Mental Status: She is alert and oriented to person, place, and time.     Motor: No weakness.  Psychiatric:        Mood and Affect: Mood and affect normal.        Behavior: Behavior normal.    BP 140/80   Pulse 74   Ht 5\' 4"  (1.626 m)   Wt 201 lb 8 oz (91.4 kg)    BMI 34.59 kg/m  Wt Readings from Last 3 Encounters:  01/29/21 201 lb 8 oz (91.4 kg)  11/29/20 203 lb 8 oz (92.3 kg)  10/30/20 205 lb (93 kg)     Health Maintenance Due  Topic Date Due  COVID-19 Vaccine (1) Never done   FOOT EXAM  Never done   OPHTHALMOLOGY EXAM  Never done   Hepatitis C Screening  Never done   TETANUS/TDAP  Never done   Zoster Vaccines- Shingrix (1 of 2) Never done   DEXA SCAN  Never done   PNA vac Low Risk Adult (1 of 2 - PCV13) Never done   HEMOGLOBIN A1C  02/25/2012    There are no preventive care reminders to display for this patient.  Lab Results  Component Value Date   TSH 2.62 08/26/2011   Lab Results  Component Value Date   WBC 5.6 07/01/2019   HGB 14.1 07/01/2019   HCT 42.0 07/01/2019   MCV 79.8 (L) 07/01/2019   PLT 186 07/01/2019   Lab Results  Component Value Date   NA 133 (L) 07/01/2019   K 3.5 07/01/2019   CO2 21 (L) 07/01/2019   GLUCOSE 298 (H) 07/01/2019   BUN 12 07/01/2019   CREATININE 0.58 07/01/2019   BILITOT 0.3 08/18/2013   ALKPHOS 103 08/18/2013   AST 20 08/18/2013   ALT 18 08/18/2013   PROT 7.4 08/18/2013   ALBUMIN 3.5 08/18/2013   CALCIUM 8.7 (L) 07/01/2019   ANIONGAP 16 (H) 07/01/2019   Lab Results  Component Value Date   CHOL 182 08/27/2011   Lab Results  Component Value Date   HDL 37 (L) 08/27/2011   Lab Results  Component Value Date   LDLCALC 124 (H) 08/27/2011   Lab Results  Component Value Date   TRIG 105 08/27/2011   No results found for: CHOLHDL Lab Results  Component Value Date   HGBA1C 9.1 (H) 08/28/2011      Assessment & Plan:   Problem List Items Addressed This Visit       Cardiovascular and Mediastinum   Primary hypertension    Patient blood pressure is normal patient denies any chest pain or shortness of breath there is no history of palpitation or paroxysmal nocturnal dyspnea   patient was advised to follow low-salt low-cholesterol diet    ideally I want to keep systolic blood  pressure below 130 mmHg, patient was asked to check blood pressure one times a week and give me a report on that.  Patient will be follow-up in 3 months  or earlier as needed, patient will call me back for any change in the cardiovascular symptoms Patient was advised to buy a book from local bookstore concerning blood pressure and read several chapters  every day.  This will be supplemented by some of the material we will give him from the office.  Patient should also utilize other resources like YouTube and Internet to learn more about the blood pressure and the diet.         Digestive   Gastroesophageal reflux disease without esophagitis    - The patient's GERD is stable on medication.  - Instructed the patient to avoid eating spicy and acidic foods, as well as foods high in fat. - Instructed the patient to avoid eating large meals or meals 2-3 hours prior to sleeping.         Endocrine   Type 2 diabetes mellitus with other specified complication (HCC)     Musculoskeletal and Integument   Osteoarthritis of left knee    We will give her tramadol for arthritis of the knee.         Other   HLD (hyperlipidemia)    Hypercholesterolemia  I advised the patient to follow  Mediterranean diet This diet is rich in fruits vegetables and whole grain, and This diet is also rich in fish and lean meat Patient should also eat a handful of almonds or walnuts daily Recent heart study indicated that average follow-up on this kind of diet reduces the cardiovascular mortality by 50 to 70%==       Obesity (BMI 35.0-39.9 without comorbidity)    - I encouraged the patient to lose weight.  - I educated them on making healthy dietary choices including eating more fruits and vegetables and less fried foods. - I encouraged the patient to exercise more, and educated on the benefits of exercise including weight loss, diabetes prevention, and hypertension prevention.   Dietary counseling with a registered  dietician  Referral to a weight management support group (e.g. Weight Watchers, Overeaters Anonymous)  If your BMI is greater than 29 or you have gained more than 15 pounds you should work on weight loss.  Attend a healthy cooking class        Other Visit Diagnoses     Burning with urination    -  Primary   Relevant Orders   POCT urinalysis dipstick       No orders of the defined types were placed in this encounter.  Patient was advised to keep increasing her insulin to 2 units every day as her blood sugar come down to 200. Follow-up: No follow-ups on file.    Corky Downs, MD

## 2021-01-29 NOTE — Assessment & Plan Note (Signed)
Hypercholesterolemia  I advised the patient to follow Mediterranean diet This diet is rich in fruits vegetables and whole grain, and This diet is also rich in fish and lean meat Patient should also eat a handful of almonds or walnuts daily Recent heart study indicated that average follow-up on this kind of diet reduces the cardiovascular mortality by 50 to 70%== 

## 2021-01-29 NOTE — Assessment & Plan Note (Signed)

## 2021-01-29 NOTE — Assessment & Plan Note (Signed)

## 2021-01-30 LAB — LIPID PANEL
Cholesterol: 175 mg/dL (ref <200)
HDL: 43 mg/dL — ABNORMAL LOW (ref >=50)
LDL Cholesterol (Calc): 112 mg/dL (calc) — ABNORMAL HIGH
Non-HDL Cholesterol (Calc): 132 mg/dL (calc) — ABNORMAL HIGH (ref <130)
Total CHOL/HDL Ratio: 4.1 (calc) (ref <5.0)
Triglycerides: 101 mg/dL (ref <150)

## 2021-01-30 LAB — COMPLETE METABOLIC PANEL WITH GFR
AG Ratio: 1.4 (calc) (ref 1.0–2.5)
ALT: 11 U/L (ref 6–29)
AST: 17 U/L (ref 10–35)
Albumin: 4.1 g/dL (ref 3.6–5.1)
Alkaline phosphatase (APISO): 65 U/L (ref 37–153)
BUN: 15 mg/dL (ref 7–25)
CO2: 23 mmol/L (ref 20–32)
Calcium: 9.6 mg/dL (ref 8.6–10.4)
Chloride: 97 mmol/L — ABNORMAL LOW (ref 98–110)
Creat: 0.69 mg/dL (ref 0.60–0.93)
GFR, Est African American: 96 mL/min/{1.73_m2} (ref >=60)
GFR, Est Non African American: 83 mL/min/{1.73_m2} (ref >=60)
Globulin: 2.9 g/dL (calc) (ref 1.9–3.7)
Glucose, Bld: 269 mg/dL — ABNORMAL HIGH (ref 65–99)
Potassium: 4.2 mmol/L (ref 3.5–5.3)
Sodium: 135 mmol/L (ref 135–146)
Total Bilirubin: 0.5 mg/dL (ref 0.2–1.2)
Total Protein: 7 g/dL (ref 6.1–8.1)

## 2021-01-30 LAB — CBC WITH DIFFERENTIAL/PLATELET
Absolute Monocytes: 464 cells/uL (ref 200–950)
Basophils Absolute: 88 cells/uL (ref 0–200)
Basophils Relative: 1.1 %
Eosinophils Absolute: 744 cells/uL — ABNORMAL HIGH (ref 15–500)
Eosinophils Relative: 9.3 %
HCT: 44.1 % (ref 35.0–45.0)
Hemoglobin: 14.1 g/dL (ref 11.7–15.5)
Lymphs Abs: 2160 cells/uL (ref 850–3900)
MCH: 27.3 pg (ref 27.0–33.0)
MCHC: 32 g/dL (ref 32.0–36.0)
MCV: 85.3 fL (ref 80.0–100.0)
MPV: 10.7 fL (ref 7.5–12.5)
Monocytes Relative: 5.8 %
Neutro Abs: 4544 cells/uL (ref 1500–7800)
Neutrophils Relative %: 56.8 %
Platelets: 257 10*3/uL (ref 140–400)
RBC: 5.17 10*6/uL — ABNORMAL HIGH (ref 3.80–5.10)
RDW: 12.3 % (ref 11.0–15.0)
Total Lymphocyte: 27 %
WBC: 8 10*3/uL (ref 3.8–10.8)

## 2021-01-30 LAB — TSH: TSH: 0.47 mIU/L (ref 0.40–4.50)

## 2021-01-30 LAB — HEMOGLOBIN A1C: Hgb A1c MFr Bld: >14 % of total Hgb — ABNORMAL HIGH (ref <5.7)

## 2021-03-05 ENCOUNTER — Ambulatory Visit (INDEPENDENT_AMBULATORY_CARE_PROVIDER_SITE_OTHER): Payer: Medicare Other | Admitting: Internal Medicine

## 2021-03-05 ENCOUNTER — Encounter: Payer: Self-pay | Admitting: Internal Medicine

## 2021-03-05 ENCOUNTER — Other Ambulatory Visit: Payer: Self-pay

## 2021-03-05 VITALS — BP 182/81 | HR 73 | Ht 64.0 in | Wt 203.7 lb

## 2021-03-05 DIAGNOSIS — I10 Essential (primary) hypertension: Secondary | ICD-10-CM | POA: Diagnosis not present

## 2021-03-05 DIAGNOSIS — E669 Obesity, unspecified: Secondary | ICD-10-CM

## 2021-03-05 DIAGNOSIS — M1712 Unilateral primary osteoarthritis, left knee: Secondary | ICD-10-CM

## 2021-03-05 DIAGNOSIS — K219 Gastro-esophageal reflux disease without esophagitis: Secondary | ICD-10-CM

## 2021-03-05 DIAGNOSIS — E1169 Type 2 diabetes mellitus with other specified complication: Secondary | ICD-10-CM | POA: Diagnosis not present

## 2021-03-05 DIAGNOSIS — E119 Type 2 diabetes mellitus without complications: Secondary | ICD-10-CM

## 2021-03-05 LAB — GLUCOSE, POCT (MANUAL RESULT ENTRY): POC Glucose: 350 mg/dl — AB (ref 70–99)

## 2021-03-05 MED ORDER — GLIPIZIDE ER 10 MG PO TB24: 10.0000 mg | ORAL_TABLET | Freq: Every day | ORAL | 5 refills | Status: DC

## 2021-03-05 MED ORDER — POLYETHYLENE GLYCOL 3350 17 GM/SCOOP PO POWD: 17.0000 g | Freq: Two times a day (BID) | ORAL | 1 refills | Status: DC | PRN

## 2021-03-05 NOTE — Assessment & Plan Note (Signed)

## 2021-03-05 NOTE — Assessment & Plan Note (Signed)

## 2021-03-05 NOTE — Assessment & Plan Note (Signed)

## 2021-03-05 NOTE — Assessment & Plan Note (Signed)

## 2021-03-05 NOTE — Assessment & Plan Note (Signed)
Better  

## 2021-03-05 NOTE — Progress Notes (Signed)
Established Patient Office Visit  Subjective:  Patient ID: Mary Hodges, female    DOB: 1940/10/06  Age: 80 y.o. MRN: [id number]  CC:  Chief Complaint  Patient presents with   Diabetes    Follow up    Diabetes She has type 2 diabetes mellitus. Her disease course has been fluctuating. Pertinent negatives for hypoglycemia include no headaches, mood changes, nervousness/anxiousness, seizures, speech difficulty or sweats. Pertinent negatives for diabetes include no blurred vision, no chest pain, no fatigue, no polydipsia, no polyphagia and no weight loss. Pertinent negatives for hypoglycemia complications include no blackouts. Pertinent negatives for diabetic complications include no autonomic neuropathy, heart disease, peripheral neuropathy or retinopathy. Risk factors for coronary artery disease include diabetes mellitus, dyslipidemia, obesity and sedentary lifestyle.  Bs 350 today  , pt ran out of his med  Mary Hodges presents forpain in both knees  Past Medical History:  Diagnosis Date   Arthritis    Asthma    GERD (gastroesophageal reflux disease)    HLD (hyperlipidemia)    HTN (hypertension)    Hypercholesteremia    Hypertension    Type 2 diabetes mellitus (HCC)     Past Surgical History:  Procedure Laterality Date   CATARACT EXTRACTION W/PHACO Right 01/19/2020   Procedure: CATARACT EXTRACTION PHACO AND INTRAOCULAR LENS PLACEMENT (IOC) RIGHT DIABETIC;  Surgeon: Galen Manila, MD;  Location: [facility];  Service: Ophthalmology;  Laterality: Right;  US1:18.4 CDE9.08 LOT [id number] H   CATARACT EXTRACTION W/PHACO Left 04/19/2020   Procedure: CATARACT EXTRACTION PHACO AND INTRAOCULAR LENS PLACEMENT (IOC) LEFT DIABETIC;  Surgeon: Galen Manila, MD;  Location: [facility];  Service: Ophthalmology;  Laterality: Left;  8.48 0:49.6   left toe surgery      Family History  Problem Relation Age of Onset   CAD Father    CVA Mother     Social History   Socioeconomic  History   Marital status: Widowed    Spouse name: Not on file   Number of children: Not on file   Years of education: Not on file   Highest education level: Not on file  Occupational History   Not on file  Tobacco Use   Smoking status: Never   Smokeless tobacco: Never  Vaping Use   Vaping Use: Never used  Substance and Sexual Activity   Alcohol use: No    Alcohol/week: 0.0 standard drinks   Drug use: No   Sexual activity: Not on file  Other Topics Concern   Not on file  Social History Narrative   Not on file   Social Determinants of Health   Financial Resource Strain: Not on file  Food Insecurity: Not on file  Transportation Needs: Not on file  Physical Activity: Not on file  Stress: Not on file  Social Connections: Not on file  Intimate Partner Violence: Not on file     Current Outpatient Medications:    polyethylene glycol powder (GLYCOLAX/MIRALAX) 17 GM/SCOOP powder, Take 17 g by mouth 2 (two) times daily as needed., Disp: 3350 g, Rfl: 1   albuterol (PROAIR HFA) 108 (90 Base) MCG/ACT inhaler, Inhale 2 puffs into the lungs every 6 (six) hours as needed., Disp: , Rfl:    amLODipine (NORVASC) 10 MG tablet, Take 1 tablet (10 mg total) by mouth daily., Disp: 90 tablet, Rfl: 3   aspirin EC 81 MG tablet, Take 81 mg by mouth daily. , Disp: , Rfl:    budesonide-formoterol (SYMBICORT) 80-4.5 MCG/ACT inhaler, Inhale 2 puffs into  the lungs 2 (two) times daily., Disp: 10.2 g, Rfl: 3   cloNIDine (CATAPRES) 0.1 MG tablet, Take 1 tablet (0.1 mg total) by mouth daily as needed (Blood pressure greater than 180 systolic)., Disp: 30 tablet, Rfl: 0   esomeprazole (NEXIUM) 40 MG capsule, Take 40 mg by mouth daily. , Disp: , Rfl:    glipiZIDE (GLUCOTROL XL) 10 MG 24 hr tablet, Take 1 tablet (10 mg total) by mouth daily., Disp: 90 tablet, Rfl: 5   Insulin Pen Needle (PEN NEEDLES) 31G X 8 MM MISC, USE TO INJECT INSULIN AS DIRECTED, Disp: 100 each, Rfl: 3   LEVEMIR FLEXTOUCH 100 UNIT/ML  FlexPen, INJECT 70 UNITS INTO THE SKIN DAILY., Disp: 3 mL, Rfl: 6   losartan (COZAAR) 100 MG tablet, Take 1 tablet (100 mg total) by mouth daily., Disp: 90 tablet, Rfl: 3   lovastatin (MEVACOR) 20 MG tablet, TAKE 1 TABLET BY MOUTH EVERY DAY, Disp: 90 tablet, Rfl: 1   metFORMIN (GLUCOPHAGE-XR) 500 MG 24 hr tablet, Take 1 tablet (500 mg total) by mouth daily., Disp: 90 tablet, Rfl: 3   oxybutynin (DITROPAN) 5 MG tablet, TAKE 1 TABLET BY MOUTH EVERY DAY, Disp: 90 tablet, Rfl: 3   promethazine (PHENERGAN) 25 MG tablet, TAKE 1 TABLET BY MOUTH TWICE A DAY, Disp: 30 tablet, Rfl: 2   valsartan-hydrochlorothiazide (DIOVAN-HCT) 160-25 MG tablet, TAKE 1 TABLET BY MOUTH EVERY DAY, Disp: 90 tablet, Rfl: 3   No Known Allergies  ROS Review of Systems  Constitutional: Negative.  Negative for fatigue and weight loss.  HENT: Negative.    Eyes: Negative.  Negative for blurred vision.  Respiratory: Negative.    Cardiovascular: Negative.  Negative for chest pain.  Gastrointestinal: Negative.   Endocrine: Negative.  Negative for polydipsia and polyphagia.  Genitourinary: Negative.   Musculoskeletal:  Positive for arthralgias, back pain, gait problem and joint swelling.  Skin: Negative.   Allergic/Immunologic: Negative.   Neurological:  Negative for seizures, speech difficulty and headaches.  Hematological: Negative.   Psychiatric/Behavioral: Negative.  Negative for agitation and behavioral problems. The patient is not nervous/anxious.   All other systems reviewed and are negative.Complain of pain in the both knees.    Objective:    Physical Exam Vitals reviewed.  Constitutional:      Appearance: Normal appearance.  HENT:     Mouth/Throat:     Mouth: Mucous membranes are moist.  Eyes:     Pupils: Pupils are equal, round, and reactive to light.  Neck:     Vascular: No carotid bruit.  Cardiovascular:     Rate and Rhythm: Normal rate and regular rhythm.     Pulses: Normal pulses.     Heart sounds:  Normal heart sounds.  Pulmonary:     Effort: Pulmonary effort is normal.     Breath sounds: Normal breath sounds.  Abdominal:     General: Bowel sounds are normal.     Palpations: Abdomen is soft. There is no hepatomegaly, splenomegaly or mass.     Tenderness: There is no abdominal tenderness.     Hernia: No hernia is present.  Musculoskeletal:        General: Swelling present. No tenderness.     Cervical back: Neck supple.     Right lower leg: No edema.     Left lower leg: No edema.     Comments: Tenderness on the both knees.  Skin:    Findings: No rash.  Neurological:     General: No focal deficit  present.     Mental Status: She is alert and oriented to person, place, and time.     Cranial Nerves: No cranial nerve deficit.     Motor: No weakness.  Psychiatric:        Mood and Affect: Mood and affect normal.        Behavior: Behavior normal.        Thought Content: Thought content normal.    BP (!) 182/81   Pulse 73   Ht 5\' 4"  (1.626 m)   Wt 203 lb 11.2 oz (92.4 kg)   BMI 34.97 kg/m  Wt Readings from Last 3 Encounters:  03/05/21 203 lb 11.2 oz (92.4 kg)  01/29/21 201 lb 8 oz (91.4 kg)  11/29/20 203 lb 8 oz (92.3 kg)     Health Maintenance Due  Topic Date Due   COVID-19 Vaccine (1) Never done   FOOT EXAM  Never done   OPHTHALMOLOGY EXAM  Never done   Hepatitis C Screening  Never done   TETANUS/TDAP  Never done   Zoster Vaccines- Shingrix (1 of 2) Never done   DEXA SCAN  Never done   PNA vac Low Risk Adult (1 of 2 - PCV13) Never done    There are no preventive care reminders to display for this patient.  Lab Results  Component Value Date   TSH 0.47 01/29/2021   Lab Results  Component Value Date   WBC 8.0 01/29/2021   HGB 14.1 01/29/2021   HCT 44.1 01/29/2021   MCV 85.3 01/29/2021   PLT 257 01/29/2021   Lab Results  Component Value Date   NA 135 01/29/2021   K 4.2 01/29/2021   CO2 23 01/29/2021   GLUCOSE 269 (H) 01/29/2021   BUN 15 01/29/2021    CREATININE 0.69 01/29/2021   BILITOT 0.5 01/29/2021   ALKPHOS 103 08/18/2013   AST 17 01/29/2021   ALT 11 01/29/2021   PROT 7.0 01/29/2021   ALBUMIN 3.5 08/18/2013   CALCIUM 9.6 01/29/2021   ANIONGAP 16 (H) 07/01/2019   Lab Results  Component Value Date   CHOL 175 01/29/2021   Lab Results  Component Value Date   HDL 43 (L) 01/29/2021   Lab Results  Component Value Date   LDLCALC 112 (H) 01/29/2021   Lab Results  Component Value Date   TRIG 101 01/29/2021   Lab Results  Component Value Date   CHOLHDL 4.1 01/29/2021   Lab Results  Component Value Date   HGBA1C >14.0 (H) 01/29/2021      Assessment & Plan:   Problem List Items Addressed This Visit       Cardiovascular and Mediastinum   Primary hypertension - Primary     Patient denies any chest pain or shortness of breath there is no history of palpitation or paroxysmal nocturnal dyspnea   patient was advised to follow low-salt low-cholesterol diet    ideally I want to keep systolic blood pressure below 324130 mmHg, patient was asked to check blood pressure one times a week and give me a report on that.  Patient will be follow-up in 3 months  or earlier as needed, patient will call me back for any change in the cardiovascular symptoms Patient was advised to buy a book from local bookstore concerning blood pressure and read several chapters  every day.  This will be supplemented by some of the material we will give him from the office.  Patient should also utilize other resources like YouTube and  Internet to learn more about the blood pressure and the diet.         Digestive   Gastroesophageal reflux disease without esophagitis    Counseling  If a person has gastroesophageal reflux disease (GERD), food and stomach acid move back up into the esophagus and cause symptoms or problems such as damage to the esophagus.  Anti-reflux measures include: raising the head of the bed, avoiding tight clothing or belts, avoiding  eating late at night, not lying down shortly after mealtime, and achieving weight loss.  Avoid ASA, NSAID's, caffeine, alcohol, and tobacco.   OTC Pepcid and/or Tums are often very helpful for as needed use.   However, for persisting chronic or daily symptoms, stronger medications like Omeprazole may be needed.  You may need to avoid foods and drinks such as: ? Coffee and tea (with or without caffeine). ? Drinks that contain alcohol. ? Energy drinks and sports drinks. ? Bubbly (carbonated) drinks or sodas. ? Chocolate and cocoa. ? Peppermint and mint flavorings. ? Garlic and onions. ? Horseradish. ? Spicy and acidic foods. These include peppers, chili powder, curry powder, vinegar, hot sauces, and BBQ sauce. ? Citrus fruit juices and citrus fruits, such as oranges, lemons, and limes. ? Tomato-based foods. These include red sauce, chili, salsa, and pizza with red sauce. ? Fried and fatty foods. These include donuts, french fries, potato chips, and high-fat dressings. ? High-fat meats. These include hot dogs, rib eye steak, sausage, ham, and bacon.        Relevant Medications   polyethylene glycol powder (GLYCOLAX/MIRALAX) 17 GM/SCOOP powder     Endocrine   Type 2 diabetes mellitus with other specified complication (HCC)    - The patient's blood sugar is labile on med. - The patient will continue the current treatment regimen.  - I encouraged the patient to regularly check blood sugar.  - I encouraged the patient to monitor diet. I encouraged the patient to eat low-carb and low-sugar to help prevent blood sugar spikes.  - I encouraged the patient to continue following their prescribed treatment plan for diabetes - I informed the patient to get help if blood sugar drops below /dL, or if suddenly have trouble thinking clearly or breathing.  Patient was advised to buy a book on diabetes from a local bookstore or from [place].  Patient should read 2 chapters every day to keep the  motivation going, this is in addition to some of the materials we provided them from the office.  There are other resources on the Internet like YouTube and [employer]kipedia to get an education on the diabetes       Relevant Medications   glipiZIDE (GLUCOTROL XL) 10 MG 24 hr tablet     Musculoskeletal and Integument   Osteoarthritis of left knee     Better         Other   Obesity (BMI 35.0-39.9 without comorbidity)    - I encouraged the patient to lose weight.  - I educated them on making healthy dietary choices including eating more fruits and vegetables and less fried foods. - I encouraged the patient to exercise more, and educated on the benefits of exercise including weight loss, diabetes prevention, and hypertension prevention.   Dietary counseling with a registered dietician  Referral to a weight management support group (e.g. Weight Watchers, Overeaters Anonymous)  If your BMI is greater than 29 or you have gained more than 15 pounds you should work on weight loss.  Attend a  healthy cooking class        Relevant Medications   glipiZIDE (GLUCOTROL XL) 10 MG 24 hr tablet   Other Visit Diagnoses     Type 2 diabetes mellitus without complications (HCC)       Relevant Medications   polyethylene glycol powder (GLYCOLAX/MIRALAX) 17 GM/SCOOP powder   glipiZIDE (GLUCOTROL XL) 10 MG 24 hr tablet   Other Relevant Orders   POCT glucose (manual entry) (Completed)       Meds ordered this encounter  Medications   polyethylene glycol powder (GLYCOLAX/MIRALAX) 17 GM/SCOOP powder    Sig: Take 17 g by mouth 2 (two) times daily as needed.    Dispense:  3350 g    Refill:  1   glipiZIDE (GLUCOTROL XL) 10 MG 24 hr tablet    Sig: Take 1 tablet (10 mg total) by mouth daily.    Dispense:  90 tablet    Refill:  5    Follow-up: No follow-ups on file.    Corky Downs, MD

## 2021-03-19 ENCOUNTER — Other Ambulatory Visit: Payer: Self-pay

## 2021-03-19 ENCOUNTER — Other Ambulatory Visit: Payer: Self-pay | Admitting: Internal Medicine

## 2021-03-19 MED ORDER — PEN NEEDLES 31G X 8 MM MISC: 3 refills | Status: DC

## 2021-03-19 MED ORDER — NOVOLOG 70/30 FLEXPEN RELION (70-30) 100 UNIT/ML ~~LOC~~ SUPN: 15.0000 [IU] | PEN_INJECTOR | Freq: Two times a day (BID) | SUBCUTANEOUS | 1 refills | Status: DC

## 2021-04-02 ENCOUNTER — Other Ambulatory Visit: Payer: Self-pay

## 2021-04-02 ENCOUNTER — Encounter: Payer: Self-pay | Admitting: Internal Medicine

## 2021-04-02 ENCOUNTER — Ambulatory Visit (INDEPENDENT_AMBULATORY_CARE_PROVIDER_SITE_OTHER): Payer: Medicare Other | Admitting: Internal Medicine

## 2021-04-02 DIAGNOSIS — M1712 Unilateral primary osteoarthritis, left knee: Secondary | ICD-10-CM | POA: Diagnosis not present

## 2021-04-02 DIAGNOSIS — E669 Obesity, unspecified: Secondary | ICD-10-CM

## 2021-04-02 DIAGNOSIS — I10 Essential (primary) hypertension: Secondary | ICD-10-CM | POA: Diagnosis not present

## 2021-04-02 DIAGNOSIS — E1169 Type 2 diabetes mellitus with other specified complication: Secondary | ICD-10-CM | POA: Diagnosis not present

## 2021-04-02 DIAGNOSIS — Z0000 Encounter for general adult medical examination without abnormal findings: Secondary | ICD-10-CM

## 2021-04-02 MED ORDER — CLONIDINE HCL 0.1 MG PO TABS: 0.1000 mg | ORAL_TABLET | Freq: Every day | ORAL | 6 refills | Status: DC | PRN

## 2021-04-02 NOTE — Assessment & Plan Note (Signed)
Advised flu shot

## 2021-04-02 NOTE — Progress Notes (Signed)
Established Patient Office Visit  Subjective:  Patient ID: Mary Hodges, female    DOB: 01-Sep-1940  Age: 80 y.o. MRN: [id number]  CC:  Chief Complaint  Patient presents with   Hypertension   Diabetes    Patient checked blood sugar from home-121    Hypertension  Diabetes   Mary Hodges presents for check up Past Medical History:  Diagnosis Date   Arthritis    Asthma    GERD (gastroesophageal reflux disease)    HLD (hyperlipidemia)    HTN (hypertension)    Hypercholesteremia    Hypertension    Type 2 diabetes mellitus (HCC)     Past Surgical History:  Procedure Laterality Date   CATARACT EXTRACTION W/PHACO Right 01/19/2020   Procedure: CATARACT EXTRACTION PHACO AND INTRAOCULAR LENS PLACEMENT (IOC) RIGHT DIABETIC;  Surgeon: Galen Manila, MD;  Location: [facility] OR[facility];  Service: Ophthalmology;  Laterality: Right;  US1:18.4 CDE9.08 LOT [id number]   CATARACT EXTRACTION W/PHACO Left 04/19/2020   Procedure: CATARACT EXTRACTION PHACO AND INTRAOCULAR LENS PLACEMENT (IOC) LEFT DIABETIC;  Surgeon: Galen Manila, MD;  Location: [facility];  Service: Ophthalmology;  Laterality: Left;  8.48 0:49.6   left toe surgery      Family History  Problem Relation Age of Onset   CAD Father    CVA Mother     Social History   Socioeconomic History   Marital status: Widowed    Spouse name: Not on file   Number of children: Not on file   Years of education: Not on file   Highest education level: Not on file  Occupational History   Not on file  Tobacco Use   Smoking status: Never   Smokeless tobacco: Never  Vaping Use   Vaping Use: Never used  Substance and Sexual Activity   Alcohol use: No    Alcohol/week: 0.0 standard drinks   Drug use: No   Sexual activity: Not on file  Other Topics Concern   Not on file  Social History Narrative   Not on file   Social Determinants of Health   Financial Resource Strain: Not on file  Food Insecurity: Not on file   Transportation Needs: Not on file  Physical Activity: Not on file  Stress: Not on file  Social Connections: Not on file  Intimate Partner Violence: Not on file     Current Outpatient Medications:    amLODipine (NORVASC) 10 MG tablet, Take 1 tablet (10 mg total) by mouth daily., Disp: 90 tablet, Rfl: 3   aspirin EC 81 MG tablet, Take 81 mg by mouth daily. , Disp: , Rfl:    budesonide-formoterol (SYMBICORT) 80-4.5 MCG/ACT inhaler, Inhale 2 puffs into the lungs 2 (two) times daily., Disp: 10.2 g, Rfl: 3   esomeprazole (NEXIUM) 40 MG capsule, Take 40 mg by mouth daily. , Disp: , Rfl:    glipiZIDE (GLUCOTROL XL) 10 MG 24 hr tablet, Take 1 tablet (10 mg total) by mouth daily., Disp: 90 tablet, Rfl: 5   Insulin Lispro Prot & Lispro (HUMALOG MIX 75/25 KWIKPEN) (75-25) 100 UNIT/ML Kwikpen, Inject 15 Units into the skin 2 (two) times daily., Disp: 3 mL, Rfl: 6   Insulin Pen Needle (PEN NEEDLES) 31G X 8 MM MISC, USE TO INJECT INSULIN AS DIRECTED, Disp: 100 each, Rfl: 3   LEVEMIR FLEXTOUCH 100 UNIT/ML FlexPen, INJECT 70 UNITS INTO THE SKIN DAILY., Disp: 3 mL, Rfl: 6   losartan (COZAAR) 100 MG tablet, Take 1 tablet (100 mg total) by mouth  daily., Disp: 90 tablet, Rfl: 3   lovastatin (MEVACOR) 20 MG tablet, TAKE 1 TABLET BY MOUTH EVERY DAY, Disp: 90 tablet, Rfl: 1   metFORMIN (GLUCOPHAGE-XR) 500 MG 24 hr tablet, Take 1 tablet (500 mg total) by mouth daily., Disp: 90 tablet, Rfl: 3   oxybutynin (DITROPAN) 5 MG tablet, TAKE 1 TABLET BY MOUTH EVERY DAY, Disp: 90 tablet, Rfl: 3   polyethylene glycol powder (GLYCOLAX/MIRALAX) 17 GM/SCOOP powder, Take 17 g by mouth 2 (two) times daily as needed., Disp: 3350 g, Rfl: 1   promethazine (PHENERGAN) 25 MG tablet, TAKE 1 TABLET BY MOUTH TWICE A DAY, Disp: 30 tablet, Rfl: 2   valsartan-hydrochlorothiazide (DIOVAN-HCT) 160-25 MG tablet, TAKE 1 TABLET BY MOUTH EVERY DAY, Disp: 90 tablet, Rfl: 3   albuterol (PROAIR HFA) 108 (90 Base) MCG/ACT inhaler, Inhale 2 puffs into  the lungs every 6 (six) hours as needed., Disp: , Rfl:    cloNIDine (CATAPRES) 0.1 MG tablet, Take 1 tablet (0.1 mg total) by mouth daily as needed (Blood pressure greater than 180 systolic)., Disp: 30 tablet, Rfl: 6   No Known Allergies  ROS Review of Systems  Constitutional: Negative.   HENT: Negative.    Eyes: Negative.   Respiratory: Negative.    Cardiovascular: Negative.   Gastrointestinal: Negative.   Endocrine: Negative.   Genitourinary: Negative.   Musculoskeletal: Negative.   Skin: Negative.   Allergic/Immunologic: Negative.   Neurological: Negative.   Hematological: Negative.   Psychiatric/Behavioral: Negative.    All other systems reviewed and are negative.    Objective:    Physical Exam Vitals reviewed.  Constitutional:      Appearance: Normal appearance.  HENT:     Mouth/Throat:     Mouth: Mucous membranes are moist.  Eyes:     Pupils: Pupils are equal, round, and reactive to light.  Neck:     Vascular: No carotid bruit.  Cardiovascular:     Rate and Rhythm: Normal rate and regular rhythm.     Pulses: Normal pulses.     Heart sounds: Normal heart sounds.  Pulmonary:     Effort: Pulmonary effort is normal.     Breath sounds: Normal breath sounds.  Abdominal:     General: Bowel sounds are normal.     Palpations: Abdomen is soft. There is no hepatomegaly, splenomegaly or mass.     Tenderness: There is no abdominal tenderness.     Hernia: No hernia is present.  Musculoskeletal:        General: No tenderness.     Cervical back: Neck supple.     Right lower leg: No edema.     Left lower leg: No edema.  Skin:    Findings: No rash.  Neurological:     Mental Status: She is alert and oriented to person, place, and time.     Motor: No weakness.  Psychiatric:        Mood and Affect: Mood and affect normal.        Behavior: Behavior normal.    There were no vitals taken for this visit. Wt Readings from Last 3 Encounters:  03/05/21 203 lb 11.2 oz (92.4  kg)  01/29/21 201 lb 8 oz (91.4 kg)  11/29/20 203 lb 8 oz (92.3 kg)     Health Maintenance Due  Topic Date Due   COVID-19 Vaccine (1) Never done   FOOT EXAM  Never done   OPHTHALMOLOGY EXAM  Never done   Hepatitis C Screening  Never done  TETANUS/TDAP  Never done   Zoster Vaccines- Shingrix (1 of 2) Never done   DEXA SCAN  Never done   PNA vac Low Risk Adult (1 of 2 - PCV13) Never done   INFLUENZA VACCINE  03/11/2021    There are no preventive care reminders to display for this patient.  Lab Results  Component Value Date   TSH 0.47 01/29/2021   Lab Results  Component Value Date   WBC 8.0 01/29/2021   HGB 14.1 01/29/2021   HCT 44.1 01/29/2021   MCV 85.3 01/29/2021   PLT 257 01/29/2021   Lab Results  Component Value Date   NA 135 01/29/2021   K 4.2 01/29/2021   CO2 23 01/29/2021   GLUCOSE 269 (H) 01/29/2021   BUN 15 01/29/2021   CREATININE 0.69 01/29/2021   BILITOT 0.5 01/29/2021   ALKPHOS 103 08/18/2013   AST 17 01/29/2021   ALT 11 01/29/2021   PROT 7.0 01/29/2021   ALBUMIN 3.5 08/18/2013   CALCIUM 9.6 01/29/2021   ANIONGAP 16 (H) 07/01/2019   Lab Results  Component Value Date   CHOL 175 01/29/2021   Lab Results  Component Value Date   HDL 43 (L) 01/29/2021   Lab Results  Component Value Date   LDLCALC 112 (H) 01/29/2021   Lab Results  Component Value Date   TRIG 101 01/29/2021   Lab Results  Component Value Date   CHOLHDL 4.1 01/29/2021   Lab Results  Component Value Date   HGBA1C >14.0 (H) 01/29/2021      Assessment & Plan:   Problem List Items Addressed This Visit       Cardiovascular and Mediastinum   Primary hypertension     Patient denies any chest pain or shortness of breath there is no history of palpitation or paroxysmal nocturnal dyspnea   patient was advised to follow low-salt low-cholesterol diet    ideally I want to keep systolic blood pressure below 767 mmHg, patient was asked to check blood pressure one times a week  and give me a report on that.  Patient will be follow-up in 3 months  or earlier as needed, patient will call me back for any change in the cardiovascular symptoms Patient was advised to buy a book from local bookstore concerning blood pressure and read several chapters  every day.  This will be supplemented by some of the material we will give him from the office.  Patient should also utilize other resources like YouTube and Internet to learn more about the blood pressure and the diet.      Relevant Medications   cloNIDine (CATAPRES) 0.1 MG tablet     Endocrine   Type 2 diabetes mellitus with other specified complication (HCC) - Primary    - The patient's blood sugar is labile on med. - The patient will continue the current treatment regimen.  - I encouraged the patient to regularly check blood sugar.  - I encouraged the patient to monitor diet. I encouraged the patient to eat low-carb and low-sugar to help prevent blood sugar spikes.  - I encouraged the patient to continue following their prescribed treatment plan for diabetes - I informed the patient to get help if blood sugar drops below 54mg /dL, or if suddenly have trouble thinking clearly or breathing.  Patient was advised to buy a book on diabetes from a local bookstore or from .  Patient should read 2 chapters every day to keep the motivation going, this is in addition to  some of the materials we provided them from the office.  There are other resources on the Internet like YouTube and wilkipedia to get an education on the diabetes        Musculoskeletal and Integument   Osteoarthritis of left knee    Better        Other   Preventative health care    Advised flu shot      Obesity (BMI 35.0-39.9 without comorbidity)    - I encouraged the patient to lose weight.  - I educated them on making healthy dietary choices including eating more fruits and vegetables and less fried foods. - I encouraged the patient to exercise  more, and educated on the benefits of exercise including weight loss, diabetes prevention, and hypertension prevention.   Dietary counseling with a registered dietician  Referral to a weight management support group (e.g. Weight Watchers, Overeaters Anonymous)  If your BMI is greater than 29 or you have gained more than 15 pounds you should work on weight loss.  Attend a healthy cooking class        Meds ordered this encounter  Medications   cloNIDine (CATAPRES) 0.1 MG tablet    Sig: Take 1 tablet (0.1 mg total) by mouth daily as needed (Blood pressure greater than 180 systolic).    Dispense:  30 tablet    Refill:  6    Follow-up: No follow-ups on file.    Corky DownsJaved Hiroko Tregre, MD

## 2021-04-02 NOTE — Assessment & Plan Note (Signed)

## 2021-04-02 NOTE — Assessment & Plan Note (Signed)

## 2021-04-02 NOTE — Assessment & Plan Note (Signed)
Better  

## 2021-04-02 NOTE — Assessment & Plan Note (Signed)

## 2021-06-18 ENCOUNTER — Other Ambulatory Visit: Payer: Self-pay | Admitting: Internal Medicine

## 2021-06-18 DIAGNOSIS — E119 Type 2 diabetes mellitus without complications: Secondary | ICD-10-CM

## 2021-07-17 ENCOUNTER — Other Ambulatory Visit: Payer: Self-pay | Admitting: Internal Medicine

## 2021-07-17 DIAGNOSIS — E11610 Type 2 diabetes mellitus with diabetic neuropathic arthropathy: Secondary | ICD-10-CM

## 2021-07-19 ENCOUNTER — Other Ambulatory Visit: Payer: Self-pay | Admitting: Internal Medicine

## 2021-07-19 DIAGNOSIS — E11610 Type 2 diabetes mellitus with diabetic neuropathic arthropathy: Secondary | ICD-10-CM

## 2021-07-23 ENCOUNTER — Ambulatory Visit (INDEPENDENT_AMBULATORY_CARE_PROVIDER_SITE_OTHER): Payer: Medicare Other | Admitting: *Deleted

## 2021-07-23 ENCOUNTER — Encounter: Payer: Medicare Other | Admitting: Internal Medicine

## 2021-07-23 DIAGNOSIS — Z0000 Encounter for general adult medical examination without abnormal findings: Secondary | ICD-10-CM

## 2021-07-23 MED ORDER — CHERATUSSIN AC 100-10 MG/5ML PO SOLN: 5.0000 mL | Freq: Three times a day (TID) | ORAL | 0 refills | Status: DC | PRN

## 2021-07-23 MED ORDER — BENZONATATE 100 MG PO CAPS: 200.0000 mg | ORAL_CAPSULE | Freq: Three times a day (TID) | ORAL | 0 refills | Status: DC | PRN

## 2021-07-23 NOTE — Addendum Note (Signed)
Addended by: Jobie Quaker on: 07/23/2021 09:59 AM   Modules accepted: Orders

## 2021-07-23 NOTE — Progress Notes (Signed)
I have reviewed this visit and agree with the documentation.   

## 2021-07-23 NOTE — Progress Notes (Signed)
Subjective:   Mary Hodges is a 80 y.o. female who presents for Medicare Annual (Subsequent) preventive examination.   I discussed the limitations of evaluation and management by telemedicine and the availability of in person appointments. Patient expressed understanding and agreed to proceed.   Visit performed using audio  Patient:home Provider:home  Review of Systems    Defer to provider  Cardiac Risk Factors include: advanced age (>73men, >70 women);hypertension     Objective:    There were no vitals filed for this visit. There is no height or weight on file to calculate BMI.  Advanced Directives 07/23/2021 04/19/2020 01/19/2020 07/01/2019 12/30/2018 12/24/2018 12/23/2018  Does Patient Have a Medical Advance Directive? No Yes No Yes No No No  Type of Advance Directive - Healthcare Power of [place];Living will - - - - -  Does patient want to make changes to medical advance directive? - No - Patient declined - - - - -  Copy of Healthcare Power of Attorney in Chart? - No - copy requested - - - - -  Would patient like information on creating a medical advance directive? No - Patient declined - No - Patient declined - No - Patient declined - No - Patient declined    Current Medications (verified) Outpatient Encounter Medications as of 07/23/2021  Medication Sig   amLODipine (NORVASC) 10 MG tablet Take 1 tablet (10 mg total) by mouth daily.   aspirin EC 81 MG tablet Take 81 mg by mouth daily.    budesonide-formoterol (SYMBICORT) 80-4.5 MCG/ACT inhaler Inhale 2 puffs into the lungs 2 (two) times daily.   cloNIDine (CATAPRES) 0.1 MG tablet Take 1 tablet (0.1 mg total) by mouth daily as needed (Blood pressure greater than 180 systolic).   esomeprazole (NEXIUM) 40 MG capsule Take 40 mg by mouth daily.    glipiZIDE (GLUCOTROL XL) 10 MG 24 hr tablet Take 1 tablet (10 mg total) by mouth daily.   HUMALOG MIX 75/25 KWIKPEN (75-25) 100 UNIT/ML KwikPen INJECT 15 UNITS INTO THE SKIN 2 (TWO)  TIMES DAILY.   Insulin Pen Needle (PEN NEEDLES) 31G X 8 MM MISC USE TO INJECT INSULIN AS DIRECTED   LEVEMIR FLEXTOUCH 100 UNIT/ML FlexTouch Pen INJECT 70 UNITS INTO THE SKIN DAILY.   losartan (COZAAR) 100 MG tablet Take 1 tablet (100 mg total) by mouth daily.   lovastatin (MEVACOR) 20 MG tablet TAKE 1 TABLET BY MOUTH EVERY DAY   metFORMIN (GLUCOPHAGE-XR) 500 MG 24 hr tablet Take 1 tablet (500 mg total) by mouth daily.   oxybutynin (DITROPAN) 5 MG tablet TAKE 1 TABLET BY MOUTH EVERY DAY   polyethylene glycol powder (GLYCOLAX/MIRALAX) 17 GM/SCOOP powder Take 17 g by mouth 2 (two) times daily as needed.   PRILOSEC OTC 20 MG tablet TAKE 1 TABLET BY MOUTH TWICE A DAY   promethazine (PHENERGAN) 25 MG tablet TAKE 1 TABLET BY MOUTH TWICE A DAY   valsartan-hydrochlorothiazide (DIOVAN-HCT) 160-25 MG tablet TAKE 1 TABLET BY MOUTH EVERY DAY   albuterol (PROAIR HFA) 108 (90 Base) MCG/ACT inhaler Inhale 2 puffs into the lungs every 6 (six) hours as needed.   No facility-administered encounter medications on file as of 07/23/2021.    Allergies (verified) Patient has no known allergies.   History: Past Medical History:  Diagnosis Date   Arthritis    Asthma    GERD (gastroesophageal reflux disease)    HLD (hyperlipidemia)    HTN (hypertension)    Hypercholesteremia    Hypertension    Type  2 diabetes mellitus [facility])    Past Surgical History:  Procedure Laterality Date   CATARACT EXTRACTION W/PHACO Right 01/19/2020   Procedure: CATARACT EXTRACTION PHACO AND INTRAOCULAR LENS PLACEMENT ([place]) RIGHT DIABETIC;  Surgeon: Birder Robson, MD;  Location: [facility];  Service: Ophthalmology;  Laterality: Right;  US1:18.4 CDE9.08 LOT [id number]   CATARACT EXTRACTION W/PHACO Left 04/19/2020   Procedure: CATARACT EXTRACTION PHACO AND INTRAOCULAR LENS PLACEMENT ([place]) LEFT DIABETIC;  Surgeon: Birder Robson, MD;  Location: [place];  Service: Ophthalmology;  Laterality: Left;  8.48 0:49.6   left toe  surgery     Family History  Problem Relation Age of Onset   CAD Father    CVA Mother    Social History   Socioeconomic History   Marital status: Widowed    Spouse name: Not on file   Number of children: Not on file   Years of education: Not on file   Highest education level: Not on file  Occupational History   Not on file  Tobacco Use   Smoking status: Never   Smokeless tobacco: Never  Vaping Use   Vaping Use: Never used  Substance and Sexual Activity   Alcohol use: No    Alcohol/week: 0.0 standard drinks   Drug use: No   Sexual activity: Not on file  Other Topics Concern   Not on file  Social History Narrative   Not on file   Social Determinants of Health   Financial Resource Strain: Low Risk    Difficulty of Paying Living Expenses: Not hard at all  Food Insecurity: No Food Insecurity   Worried About Charity fundraiser in the Last Year: Never true   [place] in the Last Year: Never true  Transportation Needs: No Transportation Needs   Lack of Transportation (Medical): No   Lack of Transportation (Non-Medical): No  Physical Activity: Inactive   Days of Exercise per Week: 0 days   Minutes of Exercise per Session: 0 min  Stress: No Stress Concern Present   Feeling of Stress : Not at all  Social Connections: Moderately Integrated   Frequency of Communication with Friends and Family: More than three times a week   Frequency of Social Gatherings with Friends and Family: More than three times a week   Attends Religious Services: More than 4 times per year   Active Member of [employer] or Organizations: Yes   Attends Archivist Meetings: More than 4 times per year   Marital Status: Widowed    Tobacco Counseling Counseling given: Not Answered   Clinical Intake:  Pre-visit preparation completed: Yes  Pain : No/denies pain     Nutritional Risks: None Diabetes: No  How often do you need to have someone help you when you read instructions,  pamphlets, or other written materials from your doctor or pharmacy?: 1 - Never What is the last grade level you completed in school?: 9th  Diabetic?no  Interpreter Needed?: No  Information entered by :: Lacretia Nicks, [place] of Daily Living In your present state of health, do you have any difficulty performing the following activities: 07/23/2021  Hearing? N  Difficulty concentrating or making decisions? N  Walking or climbing stairs? Y  Dressing or bathing? N  Doing errands, shopping? N  Preparing Food and eating ? N  Using the Toilet? N  In the past six months, have you accidently leaked urine? N  Do you have problems with loss of bowel control? N  Managing your Medications? N  Managing your Finances? N  Housekeeping or managing your Housekeeping? N  Some recent data might be hidden    Patient Care Team: Cletis Athens, MD as PCP - General (Internal Medicine)  Indicate any recent Medical Services you may have received from other than [facility] providers in the past year (date may be approximate).     Assessment:   This is a routine wellness examination for [facility].  Hearing/Vision screen No results found.  Dietary issues and exercise activities discussed: Current Exercise Habits: The patient does not participate in regular exercise at present, Exercise limited by: orthopedic condition(s)   Goals Addressed   None    Depression Screen PHQ 2/9 Scores 07/23/2021 07/23/2021 07/31/2020 12/30/2018  PHQ - 2 Score 0 0 0 0  PHQ- 9 Score 0 - - -    Fall Risk Fall Risk  07/23/2021 04/02/2021 07/31/2020  Falls in the past year? 0 0 -  Number falls in past yr: 0 0 0  Injury with Fall? 0 0 0  Risk for fall due to : - No Fall Risks -  Follow up - Falls evaluation completed -    FALL RISK PREVENTION PERTAINING TO THE HOME:  Any stairs in or around the home? No  If so, are there any without handrails? No  Home free of loose throw rugs in walkways, pet beds, electrical  cords, etc? Yes  Adequate lighting in your home to reduce risk of falls? Yes   ASSISTIVE DEVICES UTILIZED TO PREVENT FALLS:  Life alert? Yes  Use of a cane, walker or w/c? Yes  Grab bars in the bathroom? No  Shower chair or bench in shower? No  Elevated toilet seat or a handicapped toilet? No   TIMED UP AND GO:  Was the test performed? No .  Length of time to ambulate-NA  Gait slow and steady with assistive device  Cognitive Function: MMSE - Mini Mental State Exam 07/23/2021  Not completed: Unable to complete     6CIT Screen 07/23/2021  What Year? 0 points  What month? 0 points  What time? 0 points  Count back from 20 0 points  Months in reverse 0 points  Repeat phrase 0 points  Total Score 0    Immunizations Immunization History  Administered Date(s) Administered   Fluad Quad(high Dose 65+) 05/16/2020   Influenza Split 07/21/2014, 06/29/2015   Influenza, High Dose Seasonal PF 04/12/2018   Influenza,inj,Quad PF,6+ Mos 07/10/2019    TDAP status: Due, Education has been provided regarding the importance of this vaccine. Advised may receive this vaccine at local pharmacy or Health Dept. Aware to provide a copy of the vaccination record if obtained from local pharmacy or Health Dept. Verbalized acceptance and understanding.  Flu Vaccine status: Due, Education has been provided regarding the importance of this vaccine. Advised may receive this vaccine at local pharmacy or Health Dept. Aware to provide a copy of the vaccination record if obtained from local pharmacy or Health Dept. Verbalized acceptance and understanding.  Pneumococcal vaccine status: Due, Education has been provided regarding the importance of this vaccine. Advised may receive this vaccine at local pharmacy or Health Dept. Aware to provide a copy of the vaccination record if obtained from local pharmacy or Health Dept. Verbalized acceptance and understanding.  Covid-19 vaccine status: Information provided  on how to obtain vaccines.   Qualifies for Shingles Vaccine? Yes   Zostavax completed No   Shingrix Completed?: No.    Education has  been provided regarding the importance of this vaccine. Patient has been advised to call insurance company to determine out of pocket expense if they have not yet received this vaccine. Advised may also receive vaccine at local pharmacy or Health Dept. Verbalized acceptance and understanding.  Screening Tests Health Maintenance  Topic Date Due   FOOT EXAM  Never done   INFLUENZA VACCINE  03/11/2021   COVID-19 Vaccine (1) 08/08/2021 (Originally 11/29/1941)   Zoster Vaccines- Shingrix (1 of 2) 10/21/2021 (Originally 06/01/1991)   Pneumonia Vaccine 58+ Years old (1 - PCV) 07/23/2022 (Originally 06/01/1947)   DEXA SCAN  07/23/2022 (Originally 05/31/2006)   TETANUS/TDAP  07/23/2022 (Originally 05/31/1960)   HEMOGLOBIN A1C  07/31/2021   OPHTHALMOLOGY EXAM  10/09/2021   HPV VACCINES  Aged Out    Health Maintenance  Health Maintenance Due  Topic Date Due   FOOT EXAM  Never done   INFLUENZA VACCINE  03/11/2021    Colorectal cancer screening: No longer required.   Mammogram status: No longer required due to age.    Lung Cancer Screening: (Low Dose CT Chest recommended if Age 28-80 years, 30 pack-year currently smoking OR have quit w/in 15years.) does not qualify.   Lung Cancer Screening Referral: NA  Additional Screening:  Hepatitis C Screening: does not qualify  Vision Screening: Recommended annual ophthalmology exams for early detection of glaucoma and other disorders of the eye. Is the patient up to date with their annual eye exam?  Yes  Who is the provider or what is the name of the office in which the patient attends annual eye exams? [facility]  If pt is not established with a provider, would they like to be referred to a provider to establish care? No .   Dental Screening: Recommended annual dental exams for proper oral hygiene  Community  Resource Referral / Chronic Care Management: CRR required this visit?  No   CCM required this visit?  No      Plan:     I have personally reviewed and noted the following in the patients chart:   Medical and social history Use of alcohol, tobacco or illicit drugs  Current medications and supplements including opioid prescriptions.  Functional ability and status Nutritional status Physical activity Advanced directives List of other physicians Hospitalizations, surgeries, and ER visits in previous 12 months Vitals Screenings to include cognitive, depression, and falls Referrals and appointments  In addition, I have reviewed and discussed with patient certain preventive protocols, quality metrics, and best practice recommendations. A written personalized care plan for preventive services as well as general preventive health recommendations were provided to patient.     Lacretia Nicks, [place]   07/23/2021   Nurse Notes:  Ms. Swanick , Thank you for taking time to come for your Medicare Wellness Visit. I appreciate your ongoing commitment to your health goals. Please review the following plan we discussed and let me know if I can assist you in the future.   These are the goals we discussed:  Goals   None     This is a list of the screening recommended for you and due dates:  Health Maintenance  Topic Date Due   Complete foot exam   Never done   Flu Shot  03/11/2021   COVID-19 Vaccine (1) 08/08/2021*   Zoster (Shingles) Vaccine (1 of 2) 10/21/2021*   Pneumonia Vaccine (1 - PCV) 07/23/2022*   DEXA scan (bone density measurement)  07/23/2022*   Tetanus Vaccine  07/23/2022*   Hemoglobin  A1C  07/31/2021   Eye exam for diabetics  10/09/2021   HPV Vaccine  Aged Out  *Topic was postponed. The date shown is not the original due date.        Time spent 40 min

## 2021-07-23 NOTE — Addendum Note (Signed)
Addended by: Jobie Quaker on: 07/23/2021 09:58 AM   Modules accepted: Orders

## 2021-07-29 ENCOUNTER — Other Ambulatory Visit: Payer: Self-pay | Admitting: *Deleted

## 2021-07-29 DIAGNOSIS — E119 Type 2 diabetes mellitus without complications: Secondary | ICD-10-CM

## 2021-07-29 MED ORDER — LEVEMIR FLEXTOUCH 100 UNIT/ML ~~LOC~~ SOPN: 70.0000 [IU] | PEN_INJECTOR | Freq: Every day | SUBCUTANEOUS | 3 refills | Status: DC

## 2021-08-14 ENCOUNTER — Other Ambulatory Visit: Payer: Self-pay | Admitting: *Deleted

## 2021-08-14 MED ORDER — AZITHROMYCIN 250 MG PO TABS: ORAL_TABLET | ORAL | 0 refills | Status: AC

## 2021-08-14 MED ORDER — DEXTROMETHORPHAN POLISTIREX ER 30 MG/5ML PO SUER: 30.0000 mg | ORAL | 0 refills | Status: DC | PRN

## 2021-08-25 IMAGING — DX DG CHEST 1V PORT
1 series · 1 of 1 positions shown · non-contrast
Comparison: 08/08/2016

CLINICAL DATA: Cough with body ache

EXAM:
PORTABLE CHEST 1 VIEW

[chest ap]
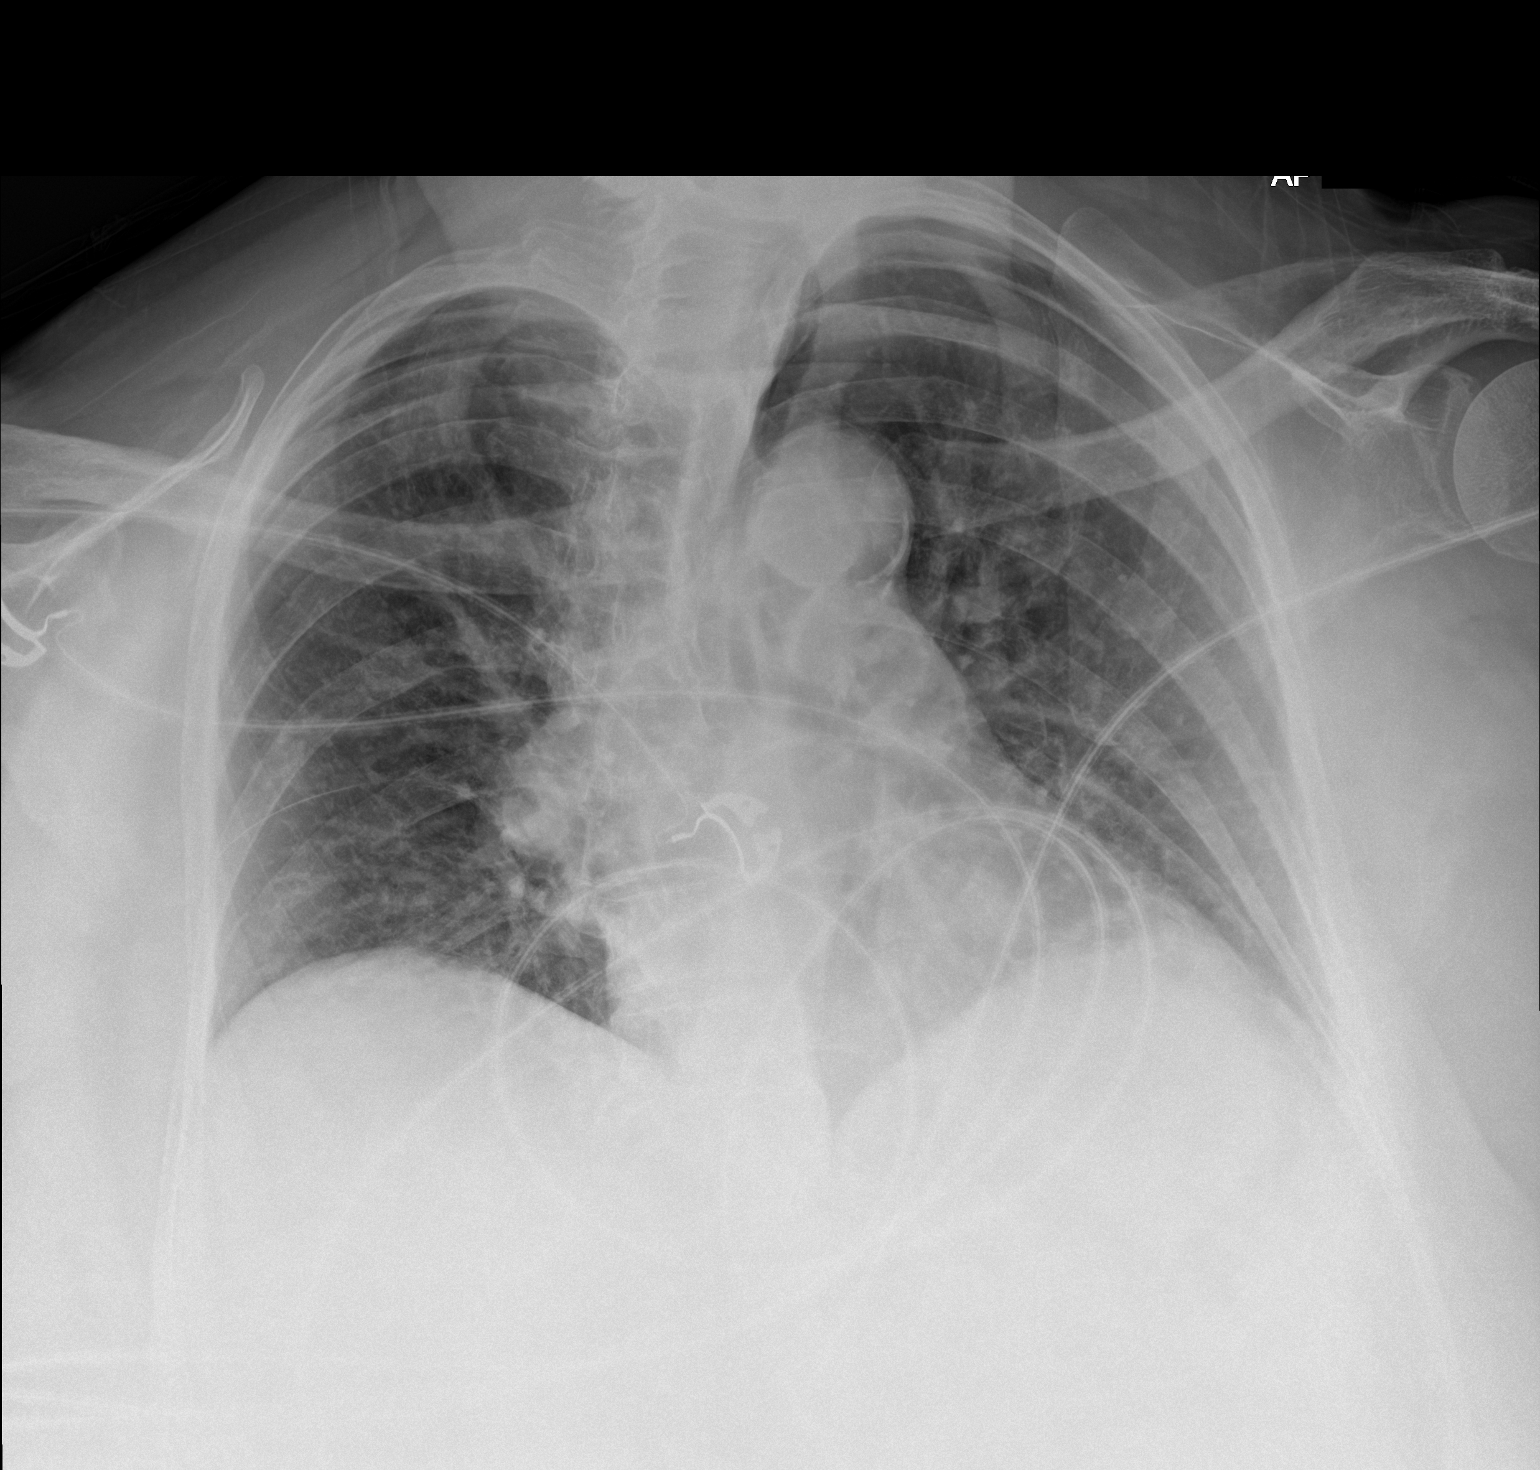

[1 of 1 positions shown; findings below may reference images not displayed]

FINDINGS: Low lung volumes. Mild cardiomegaly. Enlarged central pulmonary
vessels as before. Aortic atherosclerosis. No pneumothorax. No acute
airspace disease
IMPRESSION: No active disease.  Mild cardiomegaly.

## 2021-10-02 ENCOUNTER — Other Ambulatory Visit: Payer: Self-pay | Admitting: Internal Medicine

## 2021-10-02 DIAGNOSIS — E119 Type 2 diabetes mellitus without complications: Secondary | ICD-10-CM

## 2021-10-02 MED ORDER — GLUCOSE BLOOD VI STRP: ORAL_STRIP | 12 refills | Status: DC

## 2021-10-07 ENCOUNTER — Ambulatory Visit (INDEPENDENT_AMBULATORY_CARE_PROVIDER_SITE_OTHER): Payer: Medicare Other | Admitting: Internal Medicine

## 2021-10-07 ENCOUNTER — Encounter: Payer: Self-pay | Admitting: Internal Medicine

## 2021-10-07 ENCOUNTER — Other Ambulatory Visit: Payer: Self-pay

## 2021-10-07 VITALS — BP 209/72 | HR 83 | Ht 64.0 in | Wt 208.0 lb

## 2021-10-07 DIAGNOSIS — K219 Gastro-esophageal reflux disease without esophagitis: Secondary | ICD-10-CM | POA: Diagnosis not present

## 2021-10-07 DIAGNOSIS — I10 Essential (primary) hypertension: Secondary | ICD-10-CM

## 2021-10-07 DIAGNOSIS — E1169 Type 2 diabetes mellitus with other specified complication: Secondary | ICD-10-CM

## 2021-10-07 DIAGNOSIS — I151 Hypertension secondary to other renal disorders: Secondary | ICD-10-CM

## 2021-10-07 DIAGNOSIS — E782 Mixed hyperlipidemia: Secondary | ICD-10-CM | POA: Diagnosis not present

## 2021-10-07 DIAGNOSIS — N2889 Other specified disorders of kidney and ureter: Secondary | ICD-10-CM

## 2021-10-07 DIAGNOSIS — E669 Obesity, unspecified: Secondary | ICD-10-CM

## 2021-10-07 LAB — GLUCOSE, POCT (MANUAL RESULT ENTRY): POC Glucose: 274 mg/dl — AB (ref 70–99)

## 2021-10-07 MED ORDER — LOSARTAN POTASSIUM-HCTZ 50-12.5 MG PO TABS
1.0000 | ORAL_TABLET | Freq: Every day | ORAL | 3 refills | Status: DC
Start: 2021-10-07 — End: 2022-04-10

## 2021-10-07 MED ORDER — INSULIN LISPRO PROT & LISPRO (75-25 MIX) 100 UNIT/ML KWIKPEN: PEN_INJECTOR | SUBCUTANEOUS | 1 refills | Status: DC

## 2021-10-07 NOTE — Progress Notes (Signed)
Established Patient Office Visit  Subjective:  Patient ID: Mary Hodges Arreola, female    DOB: 1941-06-28  Age: 81 y.o. MRN: [id number]  CC:  Chief Complaint  Patient presents with   Diabetes    Diabetes   Mary Hodges Bame presents for uncontrolled  bp , pt has not taken  her any medicine today  Past Medical History:  Diagnosis Date   Arthritis    Asthma    GERD (gastroesophageal reflux disease)    HLD (hyperlipidemia)    HTN (hypertension)    Hypercholesteremia    Hypertension    Type 2 diabetes mellitus (HCC)     Past Surgical History:  Procedure Laterality Date   CATARACT EXTRACTION W/PHACO Right 01/19/2020   Procedure: CATARACT EXTRACTION PHACO AND INTRAOCULAR LENS PLACEMENT (IOC) RIGHT DIABETIC;  Surgeon: Galen ManilaPorfilio, William, MD;  Location: [facility];  Service: Ophthalmology;  Laterality: Right;  US1:18.4 CDE9.08 LOT [id number] H   CATARACT EXTRACTION W/PHACO Left 04/19/2020   Procedure: CATARACT EXTRACTION PHACO AND INTRAOCULAR LENS PLACEMENT (IOC) LEFT DIABETIC;  Surgeon: Galen ManilaPorfilio, William, MD;  [facility];  Service: Ophthalmology;  Laterality: Left;  8.48 0:49.6   left toe surgery      Family History  Problem Relation Age of Onset   CAD Father    CVA Mother     Social History   Socioeconomic History   Marital status: Widowed    Spouse name: Not on file   Number of children: Not on file   Years of education: Not on file   Highest education level: Not on file  Occupational History   Not on file  Tobacco Use   Smoking status: Never   Smokeless tobacco: Never  Vaping Use   Vaping Use: Never used  Substance and Sexual Activity   Alcohol use: No    Alcohol/week: 0.0 standard drinks   Drug use: No   Sexual activity: Not on file  Other Topics Concern   Not on file  Social History Narrative   Not on file   Social Determinants of Health   Financial Resource Strain: Low Risk    Difficulty of Paying Living Expenses: Not hard at all  Food  Insecurity: No Food Insecurity   Worried About Programme researcher, broadcasting/film/videounning Out of Food in the Last Year: Never true   Ran Out of Food in the Last Year: Never true  Transportation Needs: No Transportation Needs   Lack of Transportation (Medical): No   Lack of Transportation (Non-Medical): No  Physical Activity: Inactive   Days of Exercise per Week: 0 days   Minutes of Exercise per Session: 0 min  Stress: No Stress Concern Present   Feeling of Stress : Not at all  Social Connections: Moderately Integrated   Frequency of Communication with Friends and Family: More than three times [facility]ges week   Frequency of Social Gatherings with Friends and Family: More than three times Hodges week   Attends Religious Services: More than 4 times per year   Active Member of [employer] or Organizations: Yes   Attends BankerClub or Organization Meetings: More than 4 times per year   Marital Status: Widowed  Catering managerntimate Partner Violence: Not At Risk   Fear of Current or Ex-Partner: No   Emotionally Abused: No   Physically Abused: No   Sexually Abused: No     Current Outpatient Medications:    albuterol (PROAIR HFA) 108 (90 Base) MCG/ACT inhaler, Inhale 2 puffs into the lungs every 6 (six) hours as needed., Disp: ,  Rfl:    amLODipine (NORVASC) 10 MG tablet, Take 1 tablet (10 mg total) by mouth daily., Disp: 90 tablet, Rfl: 3   aspirin EC 81 MG tablet, Take 81 mg by mouth daily. , Disp: , Rfl:    benzonatate (TESSALON PERLES) 100 MG capsule, Take 2 capsules (200 mg total) by mouth 3 (three) times daily as needed for cough., Disp: 90 capsule, Rfl: 0   budesonide-formoterol (SYMBICORT) 80-4.5 MCG/ACT inhaler, Inhale 2 puffs into the lungs 2 (two) times daily., Disp: 10.2 g, Rfl: 3   cloNIDine (CATAPRES) 0.1 MG tablet, Take 1 tablet (0.1 mg total) by mouth daily as needed (Blood pressure greater than 180 systolic)., Disp: 30 tablet, Rfl: 6   dextromethorphan (ROBITUSSIN 12 HOUR COUGH) 30 MG/5ML liquid, Take 5 mLs (30 mg total) by mouth as needed for  cough., Disp: 89 mL, Rfl: 0   esomeprazole (NEXIUM) 40 MG capsule, Take 40 mg by mouth daily. , Disp: , Rfl:    glipiZIDE (GLUCOTROL XL) 10 MG 24 hr tablet, Take 1 tablet (10 mg total) by mouth daily., Disp: 90 tablet, Rfl: 5   glucose blood test strip, Use as instructed, Disp: 100 each, Rfl: 12   Insulin Lispro Prot & Lispro (HUMALOG MIX 75/25 KWIKPEN) (75-25) 100 UNIT/ML Kwikpen, INJECT 15 UNITS INTO THE SKIN 2 (TWO) TIMES DAILY., Disp: 3 mL, Rfl: 1   Insulin Pen Needle (PEN NEEDLES) 31G X 8 MM MISC, USE TO INJECT INSULIN AS DIRECTED, Disp: 100 each, Rfl: 3   losartan (COZAAR) 100 MG tablet, Take 1 tablet (100 mg total) by mouth daily., Disp: 90 tablet, Rfl: 3   lovastatin (MEVACOR) 20 MG tablet, TAKE 1 TABLET BY MOUTH EVERY DAY, Disp: 90 tablet, Rfl: 1   metFORMIN (GLUCOPHAGE-XR) 500 MG 24 hr tablet, Take 1 tablet (500 mg total) by mouth daily., Disp: 90 tablet, Rfl: 3   oxybutynin (DITROPAN) 5 MG tablet, TAKE 1 TABLET BY MOUTH EVERY DAY, Disp: 90 tablet, Rfl: 3   polyethylene glycol powder (GLYCOLAX/MIRALAX) 17 GM/SCOOP powder, Take 17 g by mouth 2 (two) times daily as needed., Disp: 3350 g, Rfl: 1   PRILOSEC OTC 20 MG tablet, TAKE 1 TABLET BY MOUTH TWICE Hodges DAY, Disp: 42 tablet, Rfl: 7   promethazine (PHENERGAN) 25 MG tablet, TAKE 1 TABLET BY MOUTH TWICE Hodges DAY, Disp: 30 tablet, Rfl: 2   valsartan-hydrochlorothiazide (DIOVAN-HCT) 160-25 MG tablet, TAKE 1 TABLET BY MOUTH EVERY DAY, Disp: 90 tablet, Rfl: 3   No Known Allergies  ROS Review of Systems  Constitutional: Negative.   HENT: Negative.    Eyes: Negative.   Respiratory: Negative.    Cardiovascular: Negative.   Gastrointestinal: Negative.   Endocrine: Negative.   Genitourinary: Negative.   Musculoskeletal: Negative.   Skin: Negative.   Allergic/Immunologic: Negative.   Neurological: Negative.   Hematological: Negative.   Psychiatric/Behavioral: Negative.    All other systems reviewed and are negative.    Objective:     Physical Exam Vitals reviewed.  Constitutional:      Appearance: Normal appearance.  HENT:     Mouth/Throat:     Mouth: Mucous membranes are moist.  Eyes:     Pupils: Pupils are equal, round, and reactive to light.  Neck:     Vascular: No carotid bruit.  Cardiovascular:     Rate and Rhythm: Normal rate and regular rhythm.     Pulses: Normal pulses.     Heart sounds: Normal heart sounds.  Pulmonary:  Effort: Pulmonary effort is normal.     Breath sounds: Normal breath sounds.  Abdominal:     General: Bowel sounds are normal.     Palpations: Abdomen is soft. There is no hepatomegaly, splenomegaly or mass.     Tenderness: There is no abdominal tenderness.     Hernia: No hernia is present.  Musculoskeletal:        General: No tenderness.     Cervical back: Neck supple.     Right lower leg: No edema.     Left lower leg: No edema.  Skin:    Findings: No rash.  Neurological:     Mental Status: She is alert and oriented to person, place, and time.     Motor: No weakness.  Psychiatric:        Mood and Affect: Mood and affect normal.        Behavior: Behavior normal.    BP (!) 209/72    Pulse 83    Ht 5\' 4"  (1.626 m)    Wt 208 lb (94.3 kg)    BMI 35.70 kg/m  Wt Readings from Last 3 Encounters:  10/07/21 208 lb (94.3 kg)  03/05/21 203 lb 11.2 oz (92.4 kg)  01/29/21 201 lb 8 oz (91.4 kg)     Health Maintenance Due  Topic Date Due   COVID-19 Vaccine (1) Never done   FOOT EXAM  Never done   INFLUENZA VACCINE  03/11/2021   HEMOGLOBIN A1C  07/31/2021    There are no preventive care reminders to display for this patient.  Lab Results  Component Value Date   TSH 0.47 01/29/2021   Lab Results  Component Value Date   WBC 8.0 01/29/2021   HGB 14.1 01/29/2021   HCT 44.1 01/29/2021   MCV 85.3 01/29/2021   PLT 257 01/29/2021   Lab Results  Component Value Date   NA 135 01/29/2021   K 4.2 01/29/2021   CO2 23 01/29/2021   GLUCOSE 269 (H) 01/29/2021   BUN 15  01/29/2021   CREATININE 0.69 01/29/2021   BILITOT 0.5 01/29/2021   ALKPHOS 103 08/18/2013   AST 17 01/29/2021   ALT 11 01/29/2021   PROT 7.0 01/29/2021   ALBUMIN 3.5 08/18/2013   CALCIUM 9.6 01/29/2021   ANIONGAP 16 (H) 07/01/2019   Lab Results  Component Value Date   CHOL 175 01/29/2021   Lab Results  Component Value Date   HDL 43 (L) 01/29/2021   Lab Results  Component Value Date   LDLCALC 112 (H) 01/29/2021   Lab Results  Component Value Date   TRIG 101 01/29/2021   Lab Results  Component Value Date   CHOLHDL 4.1 01/29/2021   Lab Results  Component Value Date   HGBA1C >14.0 (H) 01/29/2021      Assessment & Plan:   Problem List Items Addressed This Visit       Cardiovascular and Mediastinum   Primary hypertension    Patient was started on losartan HCTZ,        Digestive   Gastroesophageal reflux disease without esophagitis    Stable at the present time        Endocrine   Type 2 diabetes mellitus with other specified complication (HCC) - Primary    - The patient's blood sugar is labile on med. - The patient will continue the current treatment regimen.  - I encouraged the patient to regularly check blood sugar.  - I encouraged the patient to monitor diet. I  encouraged the patient to eat low-carb and low-sugar to help prevent blood sugar spikes.  - I encouraged the patient to continue following their prescribed treatment plan for diabetes - I informed the patient to get help if blood sugar drops below 54mg /dL, or if suddenly have trouble thinking clearly or breathing.  Patient was advised to buy [facility] book on diabetes from [facility] local bookstore or from [place].  Patient should read 2 chapters every day to keep the motivation going, this is in addition to some of the materials we provided them from the office.  There are other resources on the Internet like YouTube and wilkipedia to get an education on the diabetes      Relevant Medications   Insulin Lispro Prot  & Lispro (HUMALOG MIX 75/25 KWIKPEN) (75-25) 100 UNIT/ML Kwikpen   Other Relevant Orders   POCT glucose (manual entry) (Completed)     Other   HLD (hyperlipidemia)    Hypercholesterolemia  I advised the patient to follow Mediterranean diet This diet is rich in fruits vegetables and whole grain, and This diet is also rich in fish and lean meat Patient should also eat Hodges handful of almonds or walnuts daily Recent heart study indicated that average follow-up on this kind of diet reduces the cardiovascular mortality by 50 to 70%==      Obesity (BMI 35.0-39.9 without comorbidity)    - I encouraged the patient to lose weight.  - I educated them on making healthy dietary choices including eating more fruits and vegetables and less fried foods. - I encouraged the patient to exercise more, and educated on the benefits of exercise including weight loss, diabetes prevention, and hypertension prevention.   Dietary counseling with [facility] registered dietician  Referral to [facility] weight management support group (e.g. Weight Watchers, Overeaters Anonymous)  If your BMI is greater than 29 or you have gained more than 15 pounds you should work on weight loss.  Attend [facility] healthy cooking class       Relevant Medications   Insulin Lispro Prot & Lispro (HUMALOG MIX 75/25 KWIKPEN) (75-25) 100 UNIT/ML Kwikpen   Other Visit Diagnoses     Hypertension secondary to other renal disorders           Meds ordered this encounter  Medications   Insulin Lispro Prot & Lispro (HUMALOG MIX 75/25 KWIKPEN) (75-25) 100 UNIT/ML Kwikpen    Sig: INJECT 15 UNITS INTO THE SKIN 2 (TWO) TIMES DAILY.    Dispense:  3 mL    Refill:  1    Follow-up: No follow-ups on file.    Corky Downs, MD

## 2021-10-07 NOTE — Assessment & Plan Note (Signed)

## 2021-10-07 NOTE — Assessment & Plan Note (Signed)
Hypercholesterolemia  I advised the patient to follow Mediterranean diet This diet is rich in fruits vegetables and whole grain, and This diet is also rich in fish and lean meat Patient should also eat a handful of almonds or walnuts daily Recent heart study indicated that average follow-up on this kind of diet reduces the cardiovascular mortality by 50 to 70%== 

## 2021-10-07 NOTE — Assessment & Plan Note (Signed)
Patient was started on losartan HCTZ,

## 2021-10-07 NOTE — Assessment & Plan Note (Signed)

## 2021-10-07 NOTE — Assessment & Plan Note (Signed)
Stable at the present time. 

## 2021-10-13 ENCOUNTER — Other Ambulatory Visit: Payer: Self-pay | Admitting: Internal Medicine

## 2021-10-13 DIAGNOSIS — Z1331 Encounter for screening for depression: Secondary | ICD-10-CM

## 2021-10-15 ENCOUNTER — Other Ambulatory Visit: Payer: Self-pay

## 2021-10-18 ENCOUNTER — Other Ambulatory Visit: Payer: Self-pay | Admitting: Internal Medicine

## 2021-10-21 ENCOUNTER — Encounter: Payer: Self-pay | Admitting: Internal Medicine

## 2021-10-21 ENCOUNTER — Ambulatory Visit (INDEPENDENT_AMBULATORY_CARE_PROVIDER_SITE_OTHER): Payer: Medicare Other | Admitting: Internal Medicine

## 2021-10-21 VITALS — BP 130/80 | HR 90 | Ht 64.0 in | Wt 202.1 lb

## 2021-10-21 DIAGNOSIS — E1169 Type 2 diabetes mellitus with other specified complication: Secondary | ICD-10-CM

## 2021-10-21 DIAGNOSIS — M545 Low back pain, unspecified: Secondary | ICD-10-CM

## 2021-10-21 DIAGNOSIS — N342 Other urethritis: Secondary | ICD-10-CM | POA: Insufficient documentation

## 2021-10-21 DIAGNOSIS — G8929 Other chronic pain: Secondary | ICD-10-CM | POA: Diagnosis not present

## 2021-10-21 DIAGNOSIS — E669 Obesity, unspecified: Secondary | ICD-10-CM

## 2021-10-21 DIAGNOSIS — I10 Essential (primary) hypertension: Secondary | ICD-10-CM | POA: Diagnosis not present

## 2021-10-21 DIAGNOSIS — N390 Urinary tract infection, site not specified: Secondary | ICD-10-CM | POA: Diagnosis not present

## 2021-10-21 DIAGNOSIS — K219 Gastro-esophageal reflux disease without esophagitis: Secondary | ICD-10-CM

## 2021-10-21 LAB — POCT URINALYSIS DIPSTICK
Bilirubin, UA: NEGATIVE
Blood, UA: POSITIVE
Glucose, UA: NEGATIVE
Ketones, UA: NEGATIVE
Nitrite, UA: NEGATIVE
Protein, UA: NEGATIVE
Spec Grav, UA: <=1.005 — AB (ref 1.010–1.025)
Urobilinogen, UA: 0.2 E.U./dL
pH, UA: 6.5 (ref 5.0–8.0)

## 2021-10-21 LAB — GLUCOSE, POCT (MANUAL RESULT ENTRY): POC Glucose: 235 mg/dl — AB (ref 70–99)

## 2021-10-21 MED ORDER — TRAMADOL HCL 50 MG PO TABS: 50.0000 mg | ORAL_TABLET | Freq: Two times a day (BID) | ORAL | 1 refills | Status: DC

## 2021-10-21 MED ORDER — SULFAMETHOXAZOLE-TRIMETHOPRIM 400-80 MG PO TABS: 1.0000 | ORAL_TABLET | Freq: Two times a day (BID) | ORAL | 0 refills | Status: DC

## 2021-10-21 NOTE — Assessment & Plan Note (Signed)

## 2021-10-21 NOTE — Assessment & Plan Note (Signed)

## 2021-10-21 NOTE — Assessment & Plan Note (Signed)

## 2021-10-21 NOTE — Assessment & Plan Note (Signed)
-   The patient's GERD is stable on medication.  - Instructed the patient to avoid eating spicy and acidic foods, as well as foods high in fat. - Instructed the patient to avoid eating large meals or meals 2-3 hours prior to sleeping. 

## 2021-10-21 NOTE — Assessment & Plan Note (Signed)
Patient was started on Septra DS 1 twice a day for 5 days ?

## 2021-10-21 NOTE — Progress Notes (Signed)
Established Patient Office Visit  Subjective:  Patient ID: Mary Hodges, female    DOB: [birth year]  Age: 81 y.o. MRN: [id number]  CC:  Chief Complaint  Patient presents with   Hypertension   Diabetes    Hypertension  Diabetes   Mary Hodges presents for diabetes check Past Medical History:  Diagnosis Date   Arthritis    Asthma    GERD (gastroesophageal reflux disease)    HLD (hyperlipidemia)    HTN (hypertension)    Hypercholesteremia    Hypertension    Type 2 diabetes mellitus (HCC)     Past Surgical History:  Procedure Laterality Date   CATARACT EXTRACTION W/PHACO Right 01/19/2020   Procedure: CATARACT EXTRACTION PHACO AND INTRAOCULAR LENS PLACEMENT (IOC) RIGHT DIABETIC;  Surgeon: Galen Manila, MD;  Location: [facility];  Service: Ophthalmology;  Laterality: Right;  US1:18.4 CDE9.08 LOT [id number] H   CATARACT EXTRACTION W/PHACO Left 04/19/2020   Procedure: CATARACT EXTRACTION PHACO AND INTRAOCULAR LENS PLACEMENT (IOC) LEFT DIABETIC;  Surgeon: Galen Manila, MD;  Location: [facility];  Service: Ophthalmology;  Laterality: Left;  8.48 0:49.6   left toe surgery      Family History  Problem Relation Age of Onset   CAD Father    CVA Mother     Social History   Socioeconomic History   Marital status: Widowed    Spouse name: Not on file   Number of children: Not on file   Years of education: Not on file   Highest education level: Not on file  Occupational History   Not on file  Tobacco Use   Smoking status: Never   Smokeless tobacco: Never  Vaping Use   Vaping Use: Never used  Substance and Sexual Activity   Alcohol use: No    Alcohol/week: 0.0 standard drinks   Drug use: No   Sexual activity: Not on file  Other Topics Concern   Not on file  Social History Narrative   Not on file   Social Determinants of Health   Financial Resource Strain: Low Risk    Difficulty of Paying Living Expenses: Not hard at all  Food Insecurity: No Food  Insecurity   Worried About Programme researcher, broadcasting/film/video in the Last Year: Never true   Ran Out of Food in the Last Year: Never true  Transportation Needs: No Transportation Needs   Lack of Transportation (Medical): No   Lack of Transportation (Non-Medical): No  Physical Activity: Inactive   Days of Exercise per Week: 0 days   Minutes of Exercise per Session: 0 min  Stress: No Stress Concern Present   Feeling of Stress : Not at all  Social Connections: Moderately Integrated   Frequency of Communication with Friends and Family: More than three times a week   Frequency of Social Gatherings with Friends and Family: More than three times a week   Attends Religious Services: More than 4 times per year   Active Member of [employer] or Organizations: Yes   Attends Banker Meetings: More than 4 times per year   Marital Status: Widowed  Catering manager Violence: Not At Risk   Fear of Current or Ex-Partner: No   Emotionally Abused: No   Physically Abused: No   Sexually Abused: No     Current Outpatient Medications:    amLODipine (NORVASC) 10 MG tablet, Take 1 tablet (10 mg total) by mouth daily., Disp: 90 tablet, Rfl: 3   aspirin EC 81 MG tablet, Take 81 mg  by mouth daily. , Disp: , Rfl:    benzonatate (TESSALON PERLES) 100 MG capsule, Take 2 capsules (200 mg total) by mouth 3 (three) times daily as needed for cough., Disp: 90 capsule, Rfl: 0   budesonide-formoterol (SYMBICORT) 80-4.5 MCG/ACT inhaler, Inhale 2 puffs into the lungs 2 (two) times daily., Disp: 10.2 g, Rfl: 3   cloNIDine (CATAPRES) 0.1 MG tablet, TAKE 1 TABLET (0.1 MG TOTAL) BY MOUTH DAILY AS NEEDED (BLOOD PRESSURE GREATER THAN 180 SYSTOLIC)., Disp: 90 tablet, Rfl: 2   dextromethorphan (ROBITUSSIN 12 HOUR COUGH) 30 MG/5ML liquid, Take 5 mLs (30 mg total) by mouth as needed for cough., Disp: 89 mL, Rfl: 0   esomeprazole (NEXIUM) 40 MG capsule, Take 40 mg by mouth daily. , Disp: , Rfl:    glipiZIDE (GLUCOTROL XL) 10 MG 24 hr  tablet, Take 1 tablet (10 mg total) by mouth daily., Disp: 90 tablet, Rfl: 5   glucose blood test strip, Use as instructed, Disp: 100 each, Rfl: 12   Insulin Lispro Prot & Lispro (HUMALOG MIX 75/25 KWIKPEN) (75-25) 100 UNIT/ML Kwikpen, INJECT 15 UNITS INTO THE SKIN 2 (TWO) TIMES DAILY., Disp: 3 mL, Rfl: 1   Insulin Pen Needle (PEN NEEDLES) 31G X 8 MM MISC, USE TO INJECT INSULIN AS DIRECTED, Disp: 100 each, Rfl: 3   losartan (COZAAR) 100 MG tablet, Take 1 tablet (100 mg total) by mouth daily., Disp: 90 tablet, Rfl: 3   losartan-hydrochlorothiazide (HYZAAR) 50-12.5 MG tablet, Take 1 tablet by mouth daily., Disp: 90 tablet, Rfl: 3   lovastatin (MEVACOR) 20 MG tablet, TAKE 1 TABLET BY MOUTH EVERY DAY, Disp: 90 tablet, Rfl: 1   metFORMIN (GLUCOPHAGE-XR) 500 MG 24 hr tablet, Take 1 tablet (500 mg total) by mouth daily., Disp: 90 tablet, Rfl: 3   oxybutynin (DITROPAN) 5 MG tablet, TAKE 1 TABLET BY MOUTH EVERY DAY, Disp: 90 tablet, Rfl: 3   polyethylene glycol powder (GLYCOLAX/MIRALAX) 17 GM/SCOOP powder, Take 17 g by mouth 2 (two) times daily as needed., Disp: 3350 g, Rfl: 1   PRILOSEC OTC 20 MG tablet, TAKE 1 TABLET BY MOUTH TWICE A DAY, Disp: 42 tablet, Rfl: 7   promethazine (PHENERGAN) 25 MG tablet, TAKE 1 TABLET BY MOUTH TWICE A DAY, Disp: 30 tablet, Rfl: 2   sulfamethoxazole-trimethoprim (BACTRIM) 400-80 MG tablet, Take 1 tablet by mouth 2 (two) times daily., Disp: 10 tablet, Rfl: 0   valsartan-hydrochlorothiazide (DIOVAN-HCT) 160-25 MG tablet, TAKE 1 TABLET BY MOUTH EVERY DAY, Disp: 90 tablet, Rfl: 3   albuterol (PROAIR HFA) 108 (90 Base) MCG/ACT inhaler, Inhale 2 puffs into the lungs every 6 (six) hours as needed., Disp: , Rfl:    traMADol (ULTRAM) 50 MG tablet, Take 1 tablet (50 mg total) by mouth 2 (two) times daily., Disp: 30 tablet, Rfl: 1   No Known Allergies  ROS Review of Systems  Constitutional: Negative.   HENT: Negative.    Eyes: Negative.   Respiratory: Negative.     Cardiovascular: Negative.   Gastrointestinal: Negative.   Endocrine: Negative.   Genitourinary: Negative.   Musculoskeletal: Negative.   Skin: Negative.   Allergic/Immunologic: Negative.   Neurological: Negative.   Hematological: Negative.   Psychiatric/Behavioral: Negative.    All other systems reviewed and are negative.    Objective:    Physical Exam Vitals reviewed.  Constitutional:      Appearance: Normal appearance.  HENT:     Mouth/Throat:     Mouth: Mucous membranes are moist.  Eyes:  Pupils: Pupils are equal, round, and reactive to light.  Neck:     Vascular: No carotid bruit.  Cardiovascular:     Rate and Rhythm: Normal rate and regular rhythm.     Pulses: Normal pulses.     Heart sounds: Normal heart sounds.  Pulmonary:     Effort: Pulmonary effort is normal.     Breath sounds: Normal breath sounds.  Abdominal:     General: Bowel sounds are normal.     Palpations: Abdomen is soft. There is no hepatomegaly, splenomegaly or mass.     Tenderness: There is no abdominal tenderness.     Hernia: No hernia is present.  Musculoskeletal:        General: No tenderness.     Cervical back: Neck supple.     Right lower leg: No edema.     Left lower leg: No edema.  Skin:    Findings: No rash.  Neurological:     Mental Status: She is alert and oriented to person, place, and time.     Motor: No weakness.  Psychiatric:        Mood and Affect: Mood and affect normal.        Behavior: Behavior normal.    BP 130/80    Pulse 90    Ht 5\' 4"  (1.626 m)    Wt 202 lb 1.6 oz (91.7 kg)    BMI 34.69 kg/m  Wt Readings from Last 3 Encounters:  10/21/21 202 lb 1.6 oz (91.7 kg)  10/07/21 208 lb (94.3 kg)  03/05/21 203 lb 11.2 oz (92.4 kg)     Health Maintenance Due  Topic Date Due   COVID-19 Vaccine (1) Never done   FOOT EXAM  Never done   INFLUENZA VACCINE  03/11/2021   HEMOGLOBIN A1C  07/31/2021   OPHTHALMOLOGY EXAM  10/09/2021    There are no preventive care  reminders to display for this patient.  Lab Results  Component Value Date   TSH 0.47 01/29/2021   Lab Results  Component Value Date   WBC 8.0 01/29/2021   HGB 14.1 01/29/2021   HCT 44.1 01/29/2021   MCV 85.3 01/29/2021   PLT 257 01/29/2021   Lab Results  Component Value Date   NA 135 01/29/2021   K 4.2 01/29/2021   CO2 23 01/29/2021   GLUCOSE 269 (H) 01/29/2021   BUN 15 01/29/2021   CREATININE 0.69 01/29/2021   BILITOT 0.5 01/29/2021   ALKPHOS 103 08/18/2013   AST 17 01/29/2021   ALT 11 01/29/2021   PROT 7.0 01/29/2021   ALBUMIN 3.5 08/18/2013   CALCIUM 9.6 01/29/2021   ANIONGAP 16 (H) 07/01/2019   Lab Results  Component Value Date   CHOL 175 01/29/2021   Lab Results  Component Value Date   HDL 43 (L) 01/29/2021   Lab Results  Component Value Date   LDLCALC 112 (H) 01/29/2021   Lab Results  Component Value Date   TRIG 101 01/29/2021   Lab Results  Component Value Date   CHOLHDL 4.1 01/29/2021   Lab Results  Component Value Date   HGBA1C >14.0 (H) 01/29/2021      Assessment & Plan:   Problem List Items Addressed This Visit       Cardiovascular and Mediastinum   Primary hypertension     Patient denies any chest pain or shortness of breath there is no history of palpitation or paroxysmal nocturnal dyspnea   patient was advised to follow low-salt low-cholesterol diet  ideally I want to keep systolic blood pressure below 161 mmHg, patient was asked to check blood pressure one times a week and give me a report on that.  Patient will be follow-up in 3 months  or earlier as needed, patient will call me back for any change in the cardiovascular symptoms Patient was advised to buy a book from local bookstore concerning blood pressure and read several chapters  every day.  This will be supplemented by some of the material we will give him from the office.  Patient should also utilize other resources like YouTube and Internet to learn more about the blood  pressure and the diet.        Digestive   Gastroesophageal reflux disease without esophagitis    - The patient's GERD is stable on medication.  - Instructed the patient to avoid eating spicy and acidic foods, as well as foods high in fat. - Instructed the patient to avoid eating large meals or meals 2-3 hours prior to sleeping.        Endocrine   Type 2 diabetes mellitus with other specified complication (HCC) - Primary    - The patient's blood sugar is labile on med. - The patient will continue the current treatment regimen.  - I encouraged the patient to regularly check blood sugar.  - I encouraged the patient to monitor diet. I encouraged the patient to eat low-carb and low-sugar to help prevent blood sugar spikes.  - I encouraged the patient to continue following their prescribed treatment plan for diabetes - I informed the patient to get help if blood sugar drops below /dL, or if suddenly have trouble thinking clearly or breathing.  Patient was advised to buy a book on diabetes from a local bookstore or from [place].  Patient should read 2 chapters every day to keep the motivation going, this is in addition to some of the materials we provided them from the office.  There are other resources on the Internet like YouTube and wilkipedia to get an education on the diabetes      Relevant Orders   POCT glucose (manual entry) (Completed)     Genitourinary   Infective urethritis   Relevant Medications   sulfamethoxazole-trimethoprim (BACTRIM) 400-80 MG tablet   Urinary tract infection without hematuria    Patient was started on Septra DS 1 twice a day for 5 days      Relevant Medications   sulfamethoxazole-trimethoprim (BACTRIM) 400-80 MG tablet   Other Relevant Orders   POCT urinalysis dipstick (Completed)     Other   Obesity (BMI 35.0-39.9 without comorbidity)    - I encouraged the patient to lose weight.  - I educated them on making healthy dietary choices including  eating more fruits and vegetables and less fried foods. - I encouraged the patient to exercise more, and educated on the benefits of exercise including weight loss, diabetes prevention, and hypertension prevention.   Dietary counseling with a registered dietician  Referral to a weight management support group (e.g. Weight Watchers, Overeaters Anonymous)  If your BMI is greater than 29 or you have gained more than 15 pounds you should work on weight loss.  Attend a healthy cooking class        Meds ordered this encounter  Medications   sulfamethoxazole-trimethoprim (BACTRIM) 400-80 MG tablet    Sig: Take 1 tablet by mouth 2 (two) times daily.    Dispense:  10 tablet    Refill:  0   traMADol (  ULTRAM) 50 MG tablet    Sig: Take 1 tablet (50 mg total) by mouth 2 (two) times daily.    Dispense:  30 tablet    Refill:  1    Follow-up: No follow-ups on file.    Corky DownsJaved Eaton Folmar, MD

## 2021-11-18 ENCOUNTER — Ambulatory Visit (INDEPENDENT_AMBULATORY_CARE_PROVIDER_SITE_OTHER): Payer: Medicare Other | Admitting: Internal Medicine

## 2021-11-18 ENCOUNTER — Encounter: Payer: Self-pay | Admitting: Internal Medicine

## 2021-11-18 VITALS — BP 160/80 | HR 70 | Ht 64.0 in | Wt 199.9 lb

## 2021-11-18 DIAGNOSIS — Z0000 Encounter for general adult medical examination without abnormal findings: Secondary | ICD-10-CM

## 2021-11-18 DIAGNOSIS — M1712 Unilateral primary osteoarthritis, left knee: Secondary | ICD-10-CM

## 2021-11-18 DIAGNOSIS — K219 Gastro-esophageal reflux disease without esophagitis: Secondary | ICD-10-CM | POA: Diagnosis not present

## 2021-11-18 DIAGNOSIS — I10 Essential (primary) hypertension: Secondary | ICD-10-CM

## 2021-11-18 DIAGNOSIS — E669 Obesity, unspecified: Secondary | ICD-10-CM

## 2021-11-18 DIAGNOSIS — E1169 Type 2 diabetes mellitus with other specified complication: Secondary | ICD-10-CM

## 2021-11-18 LAB — GLUCOSE, POCT (MANUAL RESULT ENTRY): POC Glucose: 150 mg/dl — AB (ref 70–99)

## 2021-11-18 NOTE — Assessment & Plan Note (Signed)
Suggest shingles shot, foot exam, she has her eye exam done.  Will order hemoglobin AIC ?

## 2021-11-18 NOTE — Assessment & Plan Note (Signed)

## 2021-11-18 NOTE — Assessment & Plan Note (Signed)
Knee arthritis is stable at the present time ?

## 2021-11-18 NOTE — Progress Notes (Signed)
? ?Established Patient Office Visit ? ?Subjective:  ?Patient ID: Mary IvoryMary A Hodges, female    DOB: 04-23-1941  Age: 81 y.o. MRN: [id number] ? ?CC:  ?Chief Complaint  ?Patient presents with  ? Diabetes  ? ? ?Diabetes ? ? ?Mary Hodges presents for general check ? ?Past Medical History:  ?Diagnosis Date  ? Arthritis   ? Asthma   ? GERD (gastroesophageal reflux disease)   ? HLD (hyperlipidemia)   ? HTN (hypertension)   ? Hypercholesteremia   ? Hypertension   ? Type 2 diabetes mellitus (HCC)   ? ? ?Past Surgical History:  ?Procedure Laterality Date  ? CATARACT EXTRACTION W/PHACO Right 01/19/2020  ? Procedure: CATARACT EXTRACTION PHACO AND INTRAOCULAR LENS PLACEMENT (IOC) RIGHT DIABETIC;  Surgeon: Galen ManilaPorfilio, William, MD;  Location: [facility];  Service: Ophthalmology;  Laterality: Right;  US1:18.4 ?CDE9.08 ?LOT [id number] H  ? CATARACT EXTRACTION W/PHACO Left 04/19/2020  ? Procedure: CATARACT EXTRACTION PHACO AND INTRAOCULAR LENS PLACEMENT (IOC) LEFT DIABETIC;  Surgeon: Galen ManilaPorfilio, William, MD;  [facility];  Service: Ophthalmology;  Laterality: Left;  8.48 ?0:49.6  ? left toe surgery    ? ? ?Family History  ?Problem Relation Age of Onset  ? CAD Father   ? CVA Mother   ? ? ?Social History  ? ?Socioeconomic History  ? Marital status: Widowed  ?  Spouse name: Not on file  ? Number of children: Not on file  ? Years of education: Not on file  ? Highest education level: Not on file  ?Occupational History  ? Not on file  ?Tobacco Use  ? Smoking status: Never  ? Smokeless tobacco: Never  ?Vaping Use  ? Vaping Use: Never used  ?Substance and Sexual Activity  ? Alcohol use: No  ?  Alcohol/week: 0.0 standard drinks  ? Drug use: No  ? Sexual activity: Not on file  ?Other Topics Concern  ? Not on file  ?Social History Narrative  ? Not on file  ? ?Social Determinants of Health  ? ?Financial Resource Strain: Low Risk   ? Difficulty of Paying Living Expenses: Not hard at all  ?Food Insecurity: No Food Insecurity  ? Worried About  Programme researcher, broadcasting/film/videounning Out of Food in the Last Year: Never true  ? Ran Out of Food in the Last Year: Never true  ?Transportation Needs: No Transportation Needs  ? Lack of Transportation (Medical): No  ? Lack of Transportation (Non-Medical): No  ?Physical Activity: Inactive  ? Days of Exercise per Week: 0 days  ? Minutes of Exercise per Session: 0 min  ?Stress: No Stress Concern Present  ? Feeling of Stress : Not at all  ?Social Connections: Moderately Integrated  ? Frequency of Communication with Friends and Family: More than three times a week  ? Frequency of Social Gatherings with Friends and Family: More than three times a week  ? Attends Religious Services: More than 4 times per year  ? Active Member of Clubs or Organizations: Yes  ? Attends BankerClub or Organization Meetings: More than 4 times per year  ? Marital Status: Widowed  ?Intimate Partner Violence: Not At Risk  ? Fear of Current or Ex-Partner: No  ? Emotionally Abused: No  ? Physically Abused: No  ? Sexually Abused: No  ? ? ? ?Current Outpatient Medications:  ?  albuterol (PROAIR HFA) 108 (90 Base) MCG/ACT inhaler, Inhale 2 puffs into the lungs every 6 (six) hours as needed., Disp: , Rfl:  ?  amLODipine (NORVASC) 10 MG tablet, Take 1  tablet (10 mg total) by mouth daily., Disp: 90 tablet, Rfl: 3 ?  aspirin EC 81 MG tablet, Take 81 mg by mouth daily. , Disp: , Rfl:  ?  benzonatate (TESSALON PERLES) 100 MG capsule, Take 2 capsules (200 mg total) by mouth 3 (three) times daily as needed for cough., Disp: 90 capsule, Rfl: 0 ?  budesonide-formoterol (SYMBICORT) 80-4.5 MCG/ACT inhaler, Inhale 2 puffs into the lungs 2 (two) times daily., Disp: 10.2 g, Rfl: 3 ?  cloNIDine (CATAPRES) 0.1 MG tablet, TAKE 1 TABLET (0.1 MG TOTAL) BY MOUTH DAILY AS NEEDED (BLOOD PRESSURE GREATER THAN 180 SYSTOLIC)., Disp: 90 tablet, Rfl: 2 ?  dextromethorphan (ROBITUSSIN 12 HOUR COUGH) 30 MG/5ML liquid, Take 5 mLs (30 mg total) by mouth as needed for cough., Disp: 89 mL, Rfl: 0 ?  esomeprazole (NEXIUM)  40 MG capsule, Take 40 mg by mouth daily. , Disp: , Rfl:  ?  glipiZIDE (GLUCOTROL XL) 10 MG 24 hr tablet, Take 1 tablet (10 mg total) by mouth daily., Disp: 90 tablet, Rfl: 5 ?  glucose blood test strip, Use as instructed, Disp: 100 each, Rfl: 12 ?  Insulin Lispro Prot & Lispro (HUMALOG MIX 75/25 KWIKPEN) (75-25) 100 UNIT/ML Kwikpen, INJECT 15 UNITS INTO THE SKIN 2 (TWO) TIMES DAILY., Disp: 3 mL, Rfl: 1 ?  Insulin Pen Needle (PEN NEEDLES) 31G X 8 MM MISC, USE TO INJECT INSULIN AS DIRECTED, Disp: 100 each, Rfl: 3 ?  losartan (COZAAR) 100 MG tablet, Take 1 tablet (100 mg total) by mouth daily., Disp: 90 tablet, Rfl: 3 ?  losartan-hydrochlorothiazide (HYZAAR) 50-12.5 MG tablet, Take 1 tablet by mouth daily., Disp: 90 tablet, Rfl: 3 ?  lovastatin (MEVACOR) 20 MG tablet, TAKE 1 TABLET BY MOUTH EVERY DAY, Disp: 90 tablet, Rfl: 1 ?  metFORMIN (GLUCOPHAGE-XR) 500 MG 24 hr tablet, Take 1 tablet (500 mg total) by mouth daily., Disp: 90 tablet, Rfl: 3 ?  oxybutynin (DITROPAN) 5 MG tablet, TAKE 1 TABLET BY MOUTH EVERY DAY, Disp: 90 tablet, Rfl: 3 ?  polyethylene glycol powder (GLYCOLAX/MIRALAX) 17 GM/SCOOP powder, Take 17 g by mouth 2 (two) times daily as needed., Disp: 3350 g, Rfl: 1 ?  PRILOSEC OTC 20 MG tablet, TAKE 1 TABLET BY MOUTH TWICE A DAY, Disp: 42 tablet, Rfl: 7 ?  promethazine (PHENERGAN) 25 MG tablet, TAKE 1 TABLET BY MOUTH TWICE A DAY, Disp: 30 tablet, Rfl: 2 ?  sulfamethoxazole-trimethoprim (BACTRIM) 400-80 MG tablet, Take 1 tablet by mouth 2 (two) times daily., Disp: 10 tablet, Rfl: 0 ?  traMADol (ULTRAM) 50 MG tablet, Take 1 tablet (50 mg total) by mouth 2 (two) times daily., Disp: 30 tablet, Rfl: 1 ?  valsartan-hydrochlorothiazide (DIOVAN-HCT) 160-25 MG tablet, TAKE 1 TABLET BY MOUTH EVERY DAY, Disp: 90 tablet, Rfl: 3  ? ?No Known Allergies ? ?ROS ?Review of Systems  ?Constitutional: Negative.   ?HENT: Negative.    ?Eyes: Negative.   ?Respiratory: Negative.    ?Cardiovascular: Negative.   ?Gastrointestinal:  Negative.   ?Endocrine: Negative.   ?Genitourinary: Negative.   ?Musculoskeletal: Negative.   ?Skin: Negative.   ?Allergic/Immunologic: Negative.   ?Neurological: Negative.   ?Hematological: Negative.   ?Psychiatric/Behavioral: Negative.    ?All other systems reviewed and are negative. ? ?  ?Objective:  ?  ?Physical Exam ?Vitals reviewed.  ?Constitutional:   ?   Appearance: Normal appearance.  ?HENT:  ?   Mouth/Throat:  ?   Mouth: Mucous membranes are moist.  ?Eyes:  ?   Pupils: Pupils are  equal, round, and reactive to light.  ?Neck:  ?   Vascular: No carotid bruit.  ?Cardiovascular:  ?   Rate and Rhythm: Normal rate and regular rhythm.  ?   Pulses: Normal pulses.  ?   Heart sounds: Normal heart sounds.  ?Pulmonary:  ?   Effort: Pulmonary effort is normal.  ?   Breath sounds: Normal breath sounds.  ?Abdominal:  ?   General: Bowel sounds are normal.  ?   Palpations: Abdomen is soft. There is no hepatomegaly, splenomegaly or mass.  ?   Tenderness: There is no abdominal tenderness.  ?   Hernia: No hernia is present.  ?Musculoskeletal:     ?   General: No tenderness.  ?   Cervical back: Neck supple.  ?   Right lower leg: No edema.  ?   Left lower leg: No edema.  ?Skin: ?   Findings: No rash.  ?Neurological:  ?   Mental Status: She is alert and oriented to person, place, and time.  ?   Motor: No weakness.  ?Psychiatric:     ?   Mood and Affect: Mood and affect normal.     ?   Behavior: Behavior normal.  ? ? ?BP (!) 160/80   Pulse 70   Ht 5\' 4"  (1.626 m)   Wt 199 lb 14.4 oz (90.7 kg)   BMI 34.31 kg/m?  ?Wt Readings from Last 3 Encounters:  ?11/18/21 199 lb 14.4 oz (90.7 kg)  ?10/21/21 202 lb 1.6 oz (91.7 kg)  ?10/07/21 208 lb (94.3 kg)  ? ? ? ?Health Maintenance Due  ?Topic Date Due  ? COVID-19 Vaccine (1) Never done  ? FOOT EXAM  Never done  ? Zoster Vaccines- Shingrix (1 of 2) Never done  ? HEMOGLOBIN A1C  07/31/2021  ? OPHTHALMOLOGY EXAM  10/09/2021  ? ? ?There are no preventive care reminders to display for this  patient. ? ?Lab Results  ?Component Value Date  ? TSH 0.47 01/29/2021  ? ?Lab Results  ?Component Value Date  ? WBC 8.0 01/29/2021  ? HGB 14.1 01/29/2021  ? HCT 44.1 01/29/2021  ? MCV 85.3 01/29/2021  ? PLT 257

## 2021-11-18 NOTE — Assessment & Plan Note (Signed)

## 2021-11-18 NOTE — Assessment & Plan Note (Signed)

## 2021-12-03 ENCOUNTER — Other Ambulatory Visit: Payer: Self-pay | Admitting: Internal Medicine

## 2021-12-21 ENCOUNTER — Other Ambulatory Visit: Payer: Self-pay | Admitting: Internal Medicine

## 2021-12-21 DIAGNOSIS — Z1331 Encounter for screening for depression: Secondary | ICD-10-CM

## 2022-01-14 ENCOUNTER — Ambulatory Visit: Payer: Medicare Other | Admitting: Internal Medicine

## 2022-01-16 ENCOUNTER — Ambulatory Visit (INDEPENDENT_AMBULATORY_CARE_PROVIDER_SITE_OTHER): Payer: Medicare Other | Admitting: Nurse Practitioner

## 2022-01-16 ENCOUNTER — Encounter: Payer: Self-pay | Admitting: Nurse Practitioner

## 2022-01-16 VITALS — BP 132/84 | HR 73 | Ht 64.0 in | Wt 201.4 lb

## 2022-01-16 DIAGNOSIS — M545 Low back pain, unspecified: Secondary | ICD-10-CM

## 2022-01-16 DIAGNOSIS — E1169 Type 2 diabetes mellitus with other specified complication: Secondary | ICD-10-CM

## 2022-01-16 DIAGNOSIS — G8929 Other chronic pain: Secondary | ICD-10-CM

## 2022-01-16 DIAGNOSIS — I10 Essential (primary) hypertension: Secondary | ICD-10-CM

## 2022-01-16 LAB — POCT GLYCOSYLATED HEMOGLOBIN (HGB A1C): HbA1c POC (<> result, manual entry): 8.7 % (ref 4.0–5.6)

## 2022-01-16 LAB — GLUCOSE, POCT (MANUAL RESULT ENTRY): POC Glucose: 135 mg/dl — AB (ref 70–99)

## 2022-01-16 MED ORDER — TRAMADOL HCL 50 MG PO TABS: 50.0000 mg | ORAL_TABLET | Freq: Two times a day (BID) | ORAL | 1 refills | Status: DC

## 2022-01-16 NOTE — Progress Notes (Unsigned)
Established Patient Office Visit  Subjective:  Patient ID: Mary Hodges, female    DOB: 01-Nov-1940  Age: 81 y.o. MRN: [id number]  CC:  Chief Complaint  Patient presents with   Medication Refill   Diabetes     HPI  Mary Hodges presents for:  HPI   Past Medical History:  Diagnosis Date   Arthritis    Asthma    GERD (gastroesophageal reflux disease)    HLD (hyperlipidemia)    HTN (hypertension)    Hypercholesteremia    Hypertension    Type 2 diabetes mellitus (HCC)     Past Surgical History:  Procedure Laterality Date   CATARACT EXTRACTION W/PHACO Right 01/19/2020   Procedure: CATARACT EXTRACTION PHACO AND INTRAOCULAR LENS PLACEMENT (IOC) RIGHT DIABETIC;  Surgeon: Galen Manila, MD;  Location: [facility];  Service: Ophthalmology;  Laterality: Right;  US1:18.4 CDE9.08 LOT [id number]   CATARACT EXTRACTION W/PHACO Left 04/19/2020   Procedure: CATARACT EXTRACTION PHACO AND INTRAOCULAR LENS PLACEMENT (IOC) LEFT DIABETIC;  Surgeon: Galen Manila, MD;  Location: [facility];  Service: Ophthalmology;  Laterality: Left;  8.48 0:49.6   left toe surgery      Family History  Problem Relation Age of Onset   CAD Father    CVA Mother     Social History   Socioeconomic History   Marital status: Widowed    Spouse name: Not on file   Number of children: Not on file   Years of education: Not on file   Highest education level: Not on file  Occupational History   Not on file  Tobacco Use   Smoking status: Never   Smokeless tobacco: Never  Vaping Use   Vaping Use: Never used  Substance and Sexual Activity   Alcohol use: No    Alcohol/week: 0.0 standard drinks of alcohol   Drug use: No   Sexual activity: Not on file  Other Topics Concern   Not on file  Social History Narrative   Not on file   Social Determinants of Health   Financial Resource Strain: Low Risk  (07/23/2021)   Overall Financial Resource Strain (CARDIA)    Difficulty of Paying Living  Expenses: Not hard at all  Food Insecurity: No Food Insecurity (07/23/2021)   Hunger Vital Sign    Worried About Running Out of Food in the Last Year: Never true    Ran Out of Food in the Last Year: Never true  Transportation Needs: No Transportation Needs (07/23/2021)   PRAPARE - Administrator, Civil Service (Medical): No    Lack of Transportation (Non-Medical): No  Physical Activity: Inactive (07/23/2021)   Exercise Vital Sign    Days of Exercise per Week: 0 days    Minutes of Exercise per Session: 0 min  Stress: No Stress Concern Present (07/23/2021)   [employer] of Occupational Health - Occupational Stress Questionnaire    Feeling of Stress : Not at all  Social Connections: Moderately Integrated (07/23/2021)   Social Connection and Isolation Panel [NHANES]    Frequency of Communication with Friends and Family: More than three times a week    Frequency of Social Gatherings with Friends and Family: More than three times a week    Attends Religious Services: More than 4 times per year    Active Member of [employer] or Organizations: Yes    Attends Banker Meetings: More than 4 times per year    Marital Status: Widowed  Intimate Partner Violence: Not  At Risk (07/23/2021)   Humiliation, Afraid, Rape, and Kick questionnaire    Fear of Current or Ex-Partner: No    Emotionally Abused: No    Physically Abused: No    Sexually Abused: No     Outpatient Medications Prior to Visit  Medication Sig Dispense Refill   amLODipine (NORVASC) 10 MG tablet Take 1 tablet (10 mg total) by mouth daily. 90 tablet 3   aspirin EC 81 MG tablet Take 81 mg by mouth daily.      benzonatate (TESSALON PERLES) 100 MG capsule Take 2 capsules (200 mg total) by mouth 3 (three) times daily as needed for cough. 90 capsule 0   budesonide-formoterol (SYMBICORT) 80-4.5 MCG/ACT inhaler Inhale 2 puffs into the lungs 2 (two) times daily. 10.2 g 3   cloNIDine (CATAPRES) 0.1 MG tablet TAKE 1  TABLET (0.1 MG TOTAL) BY MOUTH DAILY AS NEEDED (BLOOD PRESSURE GREATER THAN 180 SYSTOLIC). 90 tablet 2   dextromethorphan (ROBITUSSIN 12 HOUR COUGH) 30 MG/5ML liquid Take 5 mLs (30 mg total) by mouth as needed for cough. 89 mL 0   esomeprazole (NEXIUM) 40 MG capsule Take 40 mg by mouth daily.      glipiZIDE (GLUCOTROL XL) 10 MG 24 hr tablet Take 1 tablet (10 mg total) by mouth daily. 90 tablet 5   glucose blood test strip Use as instructed 100 each 12   Insulin Lispro Prot & Lispro (HUMALOG MIX 75/25 KWIKPEN) (75-25) 100 UNIT/ML Kwikpen INJECT 15 UNITS INTO THE SKIN 2 (TWO) TIMES DAILY. 3 mL 1   Insulin Pen Needle (PEN NEEDLES) 31G X 8 MM MISC USE TO INJECT INSULIN AS DIRECTED 100 each 3   losartan (COZAAR) 100 MG tablet Take 1 tablet (100 mg total) by mouth daily. 90 tablet 3   losartan-hydrochlorothiazide (HYZAAR) 50-12.5 MG tablet Take 1 tablet by mouth daily. 90 tablet 3   lovastatin (MEVACOR) 20 MG tablet TAKE 1 TABLET BY MOUTH EVERY DAY 90 tablet 1   metFORMIN (GLUCOPHAGE-XR) 500 MG 24 hr tablet TAKE 1 TABLET BY MOUTH DAILY 90 tablet 3   oxybutynin (DITROPAN) 5 MG tablet TAKE 1 TABLET BY MOUTH EVERY DAY 90 tablet 3   polyethylene glycol powder (GLYCOLAX/MIRALAX) 17 GM/SCOOP powder Take 17 g by mouth 2 (two) times daily as needed. 3350 g 1   PRILOSEC OTC 20 MG tablet TAKE 1 TABLET BY MOUTH TWICE A DAY 42 tablet 7   promethazine (PHENERGAN) 25 MG tablet TAKE 1 TABLET BY MOUTH TWICE A DAY 30 tablet 2   sulfamethoxazole-trimethoprim (BACTRIM) 400-80 MG tablet Take 1 tablet by mouth 2 (two) times daily. 10 tablet 0   valsartan-hydrochlorothiazide (DIOVAN-HCT) 160-25 MG tablet TAKE 1 TABLET BY MOUTH EVERY DAY 90 tablet 3   traMADol (ULTRAM) 50 MG tablet Take 1 tablet (50 mg total) by mouth 2 (two) times daily. 30 tablet 1   albuterol (PROAIR HFA) 108 (90 Base) MCG/ACT inhaler Inhale 2 puffs into the lungs every 6 (six) hours as needed.     No facility-administered medications prior to visit.     No Known Allergies  ROS Review of Systems    Objective:    Physical Exam  BP 132/84   Pulse 73   Ht 5\' 4"  (1.626 m)   Wt 201 lb 6.4 oz (91.4 kg)   BMI 34.57 kg/m  Wt Readings from Last 3 Encounters:  01/16/22 201 lb 6.4 oz (91.4 kg)  11/18/21 199 lb 14.4 oz (90.7 kg)  10/21/21 202 lb 1.6 oz (  91.7 kg)     Health Maintenance Due  Topic Date Due   COVID-19 Vaccine (1) Never done   FOOT EXAM  Never done   Zoster Vaccines- Shingrix (1 of 2) Never done   OPHTHALMOLOGY EXAM  10/09/2021    There are no preventive care reminders to display for this patient.  Lab Results  Component Value Date   TSH 0.47 01/29/2021   Lab Results  Component Value Date   WBC 8.0 01/29/2021   HGB 14.1 01/29/2021   HCT 44.1 01/29/2021   MCV 85.3 01/29/2021   PLT 257 01/29/2021   Lab Results  Component Value Date   NA 135 01/29/2021   K 4.2 01/29/2021   CO2 23 01/29/2021   GLUCOSE 269 (H) 01/29/2021   BUN 15 01/29/2021   CREATININE 0.69 01/29/2021   BILITOT 0.5 01/29/2021   ALKPHOS 103 08/18/2013   AST 17 01/29/2021   ALT 11 01/29/2021   PROT 7.0 01/29/2021   ALBUMIN 3.5 08/18/2013   CALCIUM 9.6 01/29/2021   ANIONGAP 16 (H) 07/01/2019   Lab Results  Component Value Date   CHOL 175 01/29/2021   Lab Results  Component Value Date   HDL 43 (L) 01/29/2021   Lab Results  Component Value Date   LDLCALC 112 (H) 01/29/2021   Lab Results  Component Value Date   TRIG 101 01/29/2021   Lab Results  Component Value Date   CHOLHDL 4.1 01/29/2021   Lab Results  Component Value Date   HGBA1C 8.7 01/16/2022      Assessment & Plan:   Problem List Items Addressed This Visit       Endocrine   Type 2 diabetes mellitus with other specified complication (HCC) - Primary   Relevant Orders   POCT glucose (manual entry) (Completed)   POCT HgB A1C (Completed)   Other Visit Diagnoses     Chronic bilateral low back pain without sciatica       Relevant Medications    traMADol (ULTRAM) 50 MG tablet        Meds ordered this encounter  Medications   traMADol (ULTRAM) 50 MG tablet    Sig: Take 1 tablet (50 mg total) by mouth 2 (two) times daily.    Dispense:  60 tablet    Refill:  1     Follow-up: No follow-ups on file.    Kara Dies, NP

## 2022-01-21 DIAGNOSIS — M545 Low back pain, unspecified: Secondary | ICD-10-CM | POA: Insufficient documentation

## 2022-01-21 DIAGNOSIS — G8929 Other chronic pain: Secondary | ICD-10-CM | POA: Insufficient documentation

## 2022-01-21 NOTE — Assessment & Plan Note (Signed)
Patient BP 132/84 in the office today. Advised pt to follow a low sodium and heart healthy diet. Continue the current medication.

## 2022-01-21 NOTE — Assessment & Plan Note (Signed)
Stable with medication.

## 2022-01-21 NOTE — Assessment & Plan Note (Signed)
Patient BS 135 , HgA1c 8.7  in the office today. Advised pt to check the BS regularly, make a log and bring to next appointment.  Advised pt to monitor diet. Advised pt to eat variety of food including fruits, vegetables, whole grains, complex carbohydrates and proteins.

## 2022-03-07 DIAGNOSIS — B351 Tinea unguium: Secondary | ICD-10-CM | POA: Diagnosis not present

## 2022-03-07 DIAGNOSIS — M2041 Other hammer toe(s) (acquired), right foot: Secondary | ICD-10-CM | POA: Diagnosis not present

## 2022-03-07 DIAGNOSIS — E1140 Type 2 diabetes mellitus with diabetic neuropathy, unspecified: Secondary | ICD-10-CM | POA: Diagnosis not present

## 2022-03-07 DIAGNOSIS — M2042 Other hammer toe(s) (acquired), left foot: Secondary | ICD-10-CM | POA: Diagnosis not present

## 2022-03-14 ENCOUNTER — Ambulatory Visit: Payer: Medicare Other | Admitting: Nurse Practitioner

## 2022-04-10 ENCOUNTER — Ambulatory Visit (INDEPENDENT_AMBULATORY_CARE_PROVIDER_SITE_OTHER): Payer: Medicare Other | Admitting: Nurse Practitioner

## 2022-04-10 ENCOUNTER — Encounter: Payer: Self-pay | Admitting: Nurse Practitioner

## 2022-04-10 VITALS — BP 202/102 | HR 74 | Ht 64.0 in | Wt 192.1 lb

## 2022-04-10 DIAGNOSIS — Z6832 Body mass index (BMI) 32.0-32.9, adult: Secondary | ICD-10-CM

## 2022-04-10 DIAGNOSIS — J069 Acute upper respiratory infection, unspecified: Secondary | ICD-10-CM | POA: Diagnosis not present

## 2022-04-10 DIAGNOSIS — I10 Essential (primary) hypertension: Secondary | ICD-10-CM

## 2022-04-10 DIAGNOSIS — E669 Obesity, unspecified: Secondary | ICD-10-CM

## 2022-04-10 MED ORDER — AZITHROMYCIN 250 MG PO TABS: ORAL_TABLET | ORAL | 0 refills | Status: AC

## 2022-04-10 MED ORDER — VALSARTAN-HYDROCHLOROTHIAZIDE 320-25 MG PO TABS: 1.0000 | ORAL_TABLET | Freq: Every day | ORAL | 3 refills | Status: DC

## 2022-04-10 MED ORDER — BENZONATATE 100 MG PO CAPS
200.0000 mg | ORAL_CAPSULE | Freq: Three times a day (TID) | ORAL | 0 refills | Status: DC | PRN
Start: 2022-04-10 — End: 2022-07-31

## 2022-04-10 NOTE — Assessment & Plan Note (Signed)
BMI 32.97 in the office today. Advised patient to eat low-fat and heart healthy diet. Continue regular physical activity.

## 2022-04-10 NOTE — Progress Notes (Signed)
Established Patient Office Visit  Subjective:  Patient ID: Mary Hodges, female    DOB: 1941-03-19  Age: 81 y.o. MRN: [id number]  CC: No chief complaint on file.    HPI  Mary Hodges presents for dry cough, sore throat, from 2 weeks. She is taking cough medication OTC.   URI  This is a new problem. The current episode started 1 to 4 weeks ago. The problem has been unchanged. There has been no fever. Associated symptoms include coughing and a sore throat. Pertinent negatives include no chest pain, headaches or rhinorrhea. She has tried decongestant for the symptoms.    Patient have history of hypertension, diabetes, arthritis, and GERD.  Denies headache, chest pain and shortness of breath.  Past Medical History:  Diagnosis Date   Arthritis    Asthma    GERD (gastroesophageal reflux disease)    HLD (hyperlipidemia)    HTN (hypertension)    Hypercholesteremia    Hypertension    Type 2 diabetes mellitus ([place])     Past Surgical History:  Procedure Laterality Date   CATARACT EXTRACTION W/PHACO Right 01/19/2020   Procedure: CATARACT EXTRACTION PHACO AND INTRAOCULAR LENS PLACEMENT ([place]) RIGHT DIABETIC;  Surgeon: Birder Robson, MD;  Location: [facility];  Service: Ophthalmology;  Laterality: Right;  US1:18.4 CDE9.08 LOT [id number]   CATARACT EXTRACTION W/PHACO Left 04/19/2020   Procedure: CATARACT EXTRACTION PHACO AND INTRAOCULAR LENS PLACEMENT ([place]) LEFT DIABETIC;  Surgeon: Birder Robson, MD;  Location: [facility];  Service: Ophthalmology;  Laterality: Left;  8.48 0:49.6   left toe surgery      Family History  Problem Relation Age of Onset   CAD Father    CVA Mother     Social History   Socioeconomic History   Marital status: Widowed    Spouse name: Not on file   Number of children: Not on file   Years of education: Not on file   Highest education level: Not on file  Occupational History   Not on file  Tobacco Use   Smoking status: Never   Smokeless  tobacco: Never  Vaping Use   Vaping Use: Never used  Substance and Sexual Activity   Alcohol use: No    Alcohol/week: 0.0 standard drinks of alcohol   Drug use: No   Sexual activity: Not on file  Other Topics Concern   Not on file  Social History Narrative   Not on file   Social Determinants of Health   Financial Resource Strain: Low Risk  (07/23/2021)   Overall Financial Resource Strain (CARDIA)    Difficulty of Paying Living Expenses: Not hard at all  Food Insecurity: No Food Insecurity (07/23/2021)   Hunger Vital Sign    Worried About Running Out of Food in the Last Year: Never true    [place] in the Last Year: Never true  Transportation Needs: No Transportation Needs (07/23/2021)   PRAPARE - Hydrologist (Medical): No    Lack of Transportation (Non-Medical): No  Physical Activity: Inactive (07/23/2021)   Exercise Vital Sign    Days of Exercise per Week: 0 days    Minutes of Exercise per Session: 0 min  Stress: No Stress Concern Present (07/23/2021)   [place]    Feeling of Stress : Not at all  Social Connections: Moderately Integrated (07/23/2021)   Social Connection and Isolation Panel [NHANES]    Frequency of Communication with  Friends and Family: More than three times a week    Frequency of Social Gatherings with Friends and Family: More than three times a week    Attends Religious Services: More than 4 times per year    Active Member of [employer] or Organizations: Yes    Attends Banker Meetings: More than 4 times per year    Marital Status: Widowed  Intimate Partner Violence: Not At Risk (07/23/2021)   Humiliation, Afraid, Rape, and Kick questionnaire    Fear of Current or Ex-Partner: No    Emotionally Abused: No    Physically Abused: No    Sexually Abused: No     Outpatient Medications Prior to Visit  Medication Sig Dispense Refill   albuterol  (PROAIR HFA) 108 (90 Base) MCG/ACT inhaler Inhale 2 puffs into the lungs every 6 (six) hours as needed.     amLODipine (NORVASC) 10 MG tablet Take 1 tablet (10 mg total) by mouth daily. 90 tablet 3   aspirin EC 81 MG tablet Take 81 mg by mouth daily.      budesonide-formoterol (SYMBICORT) 80-4.5 MCG/ACT inhaler Inhale 2 puffs into the lungs 2 (two) times daily. 10.2 g 3   cloNIDine (CATAPRES) 0.1 MG tablet TAKE 1 TABLET (0.1 MG TOTAL) BY MOUTH DAILY AS NEEDED (BLOOD PRESSURE GREATER THAN 180 SYSTOLIC). 90 tablet 2   dextromethorphan (ROBITUSSIN 12 HOUR COUGH) 30 MG/5ML liquid Take 5 mLs (30 mg total) by mouth as needed for cough. 89 mL 0   esomeprazole (NEXIUM) 40 MG capsule Take 40 mg by mouth daily.      glipiZIDE (GLUCOTROL XL) 10 MG 24 hr tablet Take 1 tablet (10 mg total) by mouth daily. 90 tablet 5   glucose blood test strip Use as instructed 100 each 12   Insulin Lispro Prot & Lispro (HUMALOG MIX 75/25 KWIKPEN) (75-25) 100 UNIT/ML Kwikpen INJECT 15 UNITS INTO THE SKIN 2 (TWO) TIMES DAILY. 3 mL 1   Insulin Pen Needle (PEN NEEDLES) 31G X 8 MM MISC USE TO INJECT INSULIN AS DIRECTED 100 each 3   lovastatin (MEVACOR) 20 MG tablet TAKE 1 TABLET BY MOUTH EVERY DAY 90 tablet 1   metFORMIN (GLUCOPHAGE-XR) 500 MG 24 hr tablet TAKE 1 TABLET BY MOUTH DAILY 90 tablet 3   oxybutynin (DITROPAN) 5 MG tablet TAKE 1 TABLET BY MOUTH EVERY DAY 90 tablet 3   polyethylene glycol powder (GLYCOLAX/MIRALAX) 17 GM/SCOOP powder Take 17 g by mouth 2 (two) times daily as needed. 3350 g 1   PRILOSEC OTC 20 MG tablet TAKE 1 TABLET BY MOUTH TWICE A DAY 42 tablet 7   promethazine (PHENERGAN) 25 MG tablet TAKE 1 TABLET BY MOUTH TWICE A DAY 30 tablet 2   sulfamethoxazole-trimethoprim (BACTRIM) 400-80 MG tablet Take 1 tablet by mouth 2 (two) times daily. 10 tablet 0   traMADol (ULTRAM) 50 MG tablet Take 1 tablet (50 mg total) by mouth 2 (two) times daily. 60 tablet 1   benzonatate (TESSALON PERLES) 100 MG capsule Take 2  capsules (200 mg total) by mouth 3 (three) times daily as needed for cough. 90 capsule 0   losartan (COZAAR) 100 MG tablet Take 1 tablet (100 mg total) by mouth daily. 90 tablet 3   losartan-hydrochlorothiazide (HYZAAR) 50-12.5 MG tablet Take 1 tablet by mouth daily. 90 tablet 3   valsartan-hydrochlorothiazide (DIOVAN-HCT) 160-25 MG tablet TAKE 1 TABLET BY MOUTH EVERY DAY 90 tablet 3   No facility-administered medications prior to visit.    No  Known Allergies  ROS Review of Systems  Constitutional: Negative.   HENT:  Positive for sore throat. Negative for rhinorrhea.   Eyes: Negative.   Respiratory:  Positive for cough. Negative for chest tightness.   Cardiovascular:  Negative for chest pain and palpitations.  Gastrointestinal: Negative.   Genitourinary: Negative.   Musculoskeletal: Negative.   Neurological:  Negative for dizziness, facial asymmetry and headaches.  Psychiatric/Behavioral:  Negative for agitation, behavioral problems and confusion.       Objective:    Physical Exam Constitutional:      Appearance: Normal appearance. She is obese.  HENT:     Head: Normocephalic.     Right Ear: Tympanic membrane normal.     Left Ear: Tympanic membrane normal.     Nose: Nose normal.     Mouth/Throat:     Mouth: Mucous membranes are moist.     Pharynx: Oropharynx is clear.  Eyes:     Extraocular Movements: Extraocular movements intact.     Conjunctiva/sclera: Conjunctivae normal.     Pupils: Pupils are equal, round, and reactive to light.  Cardiovascular:     Rate and Rhythm: Normal rate and regular rhythm.     Pulses: Normal pulses.     Heart sounds: Normal heart sounds.  Pulmonary:     Effort: Pulmonary effort is normal. No respiratory distress.     Breath sounds: Normal breath sounds. No rhonchi.  Abdominal:     General: Bowel sounds are normal.     Palpations: Abdomen is soft. There is no mass.     Tenderness: There is no abdominal tenderness.     Hernia: No hernia  is present.  Musculoskeletal:        General: Normal range of motion.     Cervical back: Neck supple. No tenderness.  Skin:    General: Skin is warm.     Capillary Refill: Capillary refill takes less than 2 seconds.  Neurological:     General: No focal deficit present.     Mental Status: She is alert and oriented to person, place, and time. Mental status is at baseline.  Psychiatric:        Mood and Affect: Mood normal.        Behavior: Behavior normal.        Thought Content: Thought content normal.        Judgment: Judgment normal.     BP (!) 202/102   Pulse 74   Ht 5\' 4"  (1.626 m)   Wt 192 lb 1 oz (87.1 kg)   BMI 32.97 kg/m  Wt Readings from Last 3 Encounters:  04/10/22 192 lb 1 oz (87.1 kg)  01/16/22 201 lb 6.4 oz (91.4 kg)  11/18/21 199 lb 14.4 oz (90.7 kg)     Health Maintenance  Topic Date Due   COVID-19 Vaccine (1) Never done   FOOT EXAM  Never done   Zoster Vaccines- Shingrix (1 of 2) Never done   OPHTHALMOLOGY EXAM  10/09/2021   INFLUENZA VACCINE  03/11/2022   Pneumonia Vaccine 48+ Years old (1 - PCV) 07/23/2022 (Originally 05/31/2006)   DEXA SCAN  07/23/2022 (Originally 05/31/2006)   TETANUS/TDAP  07/23/2022 (Originally 05/31/1960)   HEMOGLOBIN A1C  07/18/2022   HPV VACCINES  Aged Out    There are no preventive care reminders to display for this patient.  Lab Results  Component Value Date   TSH 0.47 01/29/2021   Lab Results  Component Value Date   WBC 8.0 01/29/2021  HGB 14.1 01/29/2021   HCT 44.1 01/29/2021   MCV 85.3 01/29/2021   PLT 257 01/29/2021   Lab Results  Component Value Date   NA 135 01/29/2021   K 4.2 01/29/2021   CO2 23 01/29/2021   GLUCOSE 269 (H) 01/29/2021   BUN 15 01/29/2021   CREATININE 0.69 01/29/2021   BILITOT 0.5 01/29/2021   ALKPHOS 103 08/18/2013   AST 17 01/29/2021   ALT 11 01/29/2021   PROT 7.0 01/29/2021   ALBUMIN 3.5 08/18/2013   CALCIUM 9.6 01/29/2021   ANIONGAP 16 (H) 07/01/2019   Lab Results   Component Value Date   CHOL 175 01/29/2021   Lab Results  Component Value Date   HDL 43 (L) 01/29/2021   Lab Results  Component Value Date   LDLCALC 112 (H) 01/29/2021   Lab Results  Component Value Date   TRIG 101 01/29/2021   Lab Results  Component Value Date   CHOLHDL 4.1 01/29/2021   Lab Results  Component Value Date   HGBA1C 8.7 01/16/2022      Assessment & Plan:   Problem List Items Addressed This Visit       Cardiovascular and Mediastinum   Primary hypertension    Patient blood pressure not well controlled. Advised patient to bring all her medication next week for medication check. Patient was accompanied by her son and patient states that she was taking over-the-counter cough syrup. Advised patient to eat low-salt heart healthy diet. We will continue to monitor.       Relevant Medications   valsartan-hydrochlorothiazide (DIOVAN-HCT) 320-25 MG tablet     Respiratory   Upper respiratory tract infection - Primary    Started her on Z-Pak Tessalon Perles. Advised patient to do warm salt water gargles.      Relevant Medications   azithromycin (ZITHROMAX) 250 MG tablet     Other   Class 1 obesity with serious comorbidity and body mass index (BMI) of 32.0 to 32.9 in adult    BMI 32.97 in the office today. Advised patient to eat low-fat and heart healthy diet. Continue regular physical activity.          Meds ordered this encounter  Medications   azithromycin (ZITHROMAX) 250 MG tablet    Sig: Take 2 tablets on day 1, then 1 tablet daily on days 2 through 5    Dispense:  6 tablet    Refill:  0   benzonatate (TESSALON PERLES) 100 MG capsule    Sig: Take 2 capsules (200 mg total) by mouth 3 (three) times daily as needed for cough.    Dispense:  20 capsule    Refill:  0   valsartan-hydrochlorothiazide (DIOVAN-HCT) 320-25 MG tablet    Sig: Take 1 tablet by mouth daily.    Dispense:  90 tablet    Refill:  3     Follow-up: No follow-ups on  file.    Kara Dies, NP

## 2022-04-10 NOTE — Assessment & Plan Note (Signed)
Started her on Z-Pak [employer]. Advised patient to do warm salt water gargles.

## 2022-04-10 NOTE — Assessment & Plan Note (Addendum)
Patient blood pressure not well controlled. Advised patient to bring all her medication next week for medication check. Patient was accompanied by her son and patient states that she was taking over-the-counter cough syrup. Advised patient to eat low-salt heart healthy diet. We will continue to monitor.

## 2022-04-13 DIAGNOSIS — Z7984 Long term (current) use of oral hypoglycemic drugs: Secondary | ICD-10-CM | POA: Diagnosis not present

## 2022-04-13 DIAGNOSIS — N3000 Acute cystitis without hematuria: Secondary | ICD-10-CM | POA: Diagnosis not present

## 2022-04-13 DIAGNOSIS — R1031 Right lower quadrant pain: Secondary | ICD-10-CM | POA: Diagnosis not present

## 2022-04-13 DIAGNOSIS — K219 Gastro-esophageal reflux disease without esophagitis: Secondary | ICD-10-CM | POA: Diagnosis not present

## 2022-04-13 DIAGNOSIS — R143 Flatulence: Secondary | ICD-10-CM | POA: Diagnosis not present

## 2022-04-13 DIAGNOSIS — Z794 Long term (current) use of insulin: Secondary | ICD-10-CM | POA: Diagnosis not present

## 2022-04-13 DIAGNOSIS — Z79891 Long term (current) use of opiate analgesic: Secondary | ICD-10-CM | POA: Diagnosis not present

## 2022-04-13 DIAGNOSIS — E785 Hyperlipidemia, unspecified: Secondary | ICD-10-CM | POA: Diagnosis not present

## 2022-04-13 DIAGNOSIS — I10 Essential (primary) hypertension: Secondary | ICD-10-CM | POA: Diagnosis not present

## 2022-04-13 DIAGNOSIS — Z79899 Other long term (current) drug therapy: Secondary | ICD-10-CM | POA: Diagnosis not present

## 2022-04-13 DIAGNOSIS — Z7982 Long term (current) use of aspirin: Secondary | ICD-10-CM | POA: Diagnosis not present

## 2022-04-13 DIAGNOSIS — R10813 Right lower quadrant abdominal tenderness: Secondary | ICD-10-CM | POA: Diagnosis not present

## 2022-04-13 DIAGNOSIS — R9341 Abnormal radiologic findings on diagnostic imaging of renal pelvis, ureter, or bladder: Secondary | ICD-10-CM | POA: Diagnosis not present

## 2022-04-13 DIAGNOSIS — K5900 Constipation, unspecified: Secondary | ICD-10-CM | POA: Diagnosis not present

## 2022-04-13 DIAGNOSIS — J45909 Unspecified asthma, uncomplicated: Secondary | ICD-10-CM | POA: Diagnosis not present

## 2022-04-13 DIAGNOSIS — I491 Atrial premature depolarization: Secondary | ICD-10-CM | POA: Diagnosis not present

## 2022-04-13 DIAGNOSIS — E119 Type 2 diabetes mellitus without complications: Secondary | ICD-10-CM | POA: Diagnosis not present

## 2022-04-16 ENCOUNTER — Other Ambulatory Visit: Payer: Self-pay | Admitting: Internal Medicine

## 2022-04-16 DIAGNOSIS — Z1331 Encounter for screening for depression: Secondary | ICD-10-CM

## 2022-04-17 ENCOUNTER — Ambulatory Visit: Payer: Medicare Other | Admitting: Nurse Practitioner

## 2022-04-17 ENCOUNTER — Other Ambulatory Visit: Payer: Self-pay | Admitting: Internal Medicine

## 2022-06-06 ENCOUNTER — Encounter: Payer: Self-pay | Admitting: Nurse Practitioner

## 2022-06-06 ENCOUNTER — Ambulatory Visit (INDEPENDENT_AMBULATORY_CARE_PROVIDER_SITE_OTHER): Payer: Medicare Other | Admitting: Nurse Practitioner

## 2022-06-06 VITALS — BP 138/85 | HR 78 | Ht 64.0 in | Wt 188.8 lb

## 2022-06-06 DIAGNOSIS — I10 Essential (primary) hypertension: Secondary | ICD-10-CM | POA: Diagnosis not present

## 2022-06-06 DIAGNOSIS — Z794 Long term (current) use of insulin: Secondary | ICD-10-CM

## 2022-06-06 DIAGNOSIS — E1169 Type 2 diabetes mellitus with other specified complication: Secondary | ICD-10-CM | POA: Diagnosis not present

## 2022-06-06 DIAGNOSIS — M1712 Unilateral primary osteoarthritis, left knee: Secondary | ICD-10-CM

## 2022-06-06 LAB — POCT GLYCOSYLATED HEMOGLOBIN (HGB A1C): HbA1c POC (<> result, manual entry): 10.1 % (ref 4.0–5.6)

## 2022-06-06 LAB — GLUCOSE, POCT (MANUAL RESULT ENTRY): POC Glucose: 201 mg/dl — AB (ref 70–99)

## 2022-06-06 MED ORDER — TRAMADOL HCL 50 MG PO TABS: 50.0000 mg | ORAL_TABLET | Freq: Two times a day (BID) | ORAL | 0 refills | Status: DC

## 2022-06-06 NOTE — Progress Notes (Unsigned)
Established Patient Office Visit  Subjective:  Patient ID: Mary Hodges, female    DOB: 1941-04-12  Age: 81 y.o. MRN: [id number]  CC:  Chief Complaint  Patient presents with   Leg Pain    Patient needs refill of her tramadol. Patient has been out of it and leg and has been hurting.    Diabetes     HPI  Mary Hodges presents for routine follow up. She complai  Leg Pain   Diabetes Pertinent negatives for hypoglycemia include no confusion.     Past Medical History:  Diagnosis Date   Arthritis    Asthma    GERD (gastroesophageal reflux disease)    HLD (hyperlipidemia)    HTN (hypertension)    Hypercholesteremia    Hypertension    Type 2 diabetes mellitus (HCC)     Past Surgical History:  Procedure Laterality Date   CATARACT EXTRACTION W/PHACO Right 01/19/2020   Procedure: CATARACT EXTRACTION PHACO AND INTRAOCULAR LENS PLACEMENT (IOC) RIGHT DIABETIC;  Surgeon: Galen Manila, MD;  Location: [facility];  Service: Ophthalmology;  Laterality: Right;  US1:18.4 CDE9.08 LOT [id number] H   CATARACT EXTRACTION W/PHACO Left 04/19/2020   Procedure: CATARACT EXTRACTION PHACO AND INTRAOCULAR LENS PLACEMENT (IOC) LEFT DIABETIC;  Surgeon: Galen Manila, MD;  Location: [facility];  Service: Ophthalmology;  Laterality: Left;  8.48 0:49.6   left toe surgery      Family History  Problem Relation Age of Onset   CAD Father    CVA Mother     Social History   Socioeconomic History   Marital status: Widowed    Spouse name: Not on file   Number of children: Not on file   Years of education: Not on file   Highest education level: Not on file  Occupational History   Not on file  Tobacco Use   Smoking status: Never   Smokeless tobacco: Never  Vaping Use   Vaping Use: Never used  Substance and Sexual Activity   Alcohol use: No    Alcohol/week: 0.0 standard drinks of alcohol   Drug use: No   Sexual activity: Not on file  Other Topics Concern   Not on file  Social  History Narrative   Not on file   Social Determinants of Health   Financial Resource Strain: Low Risk  (07/23/2021)   Overall Financial Resource Strain (CARDIA)    Difficulty of Paying Living Expenses: Not hard at all  Food Insecurity: No Food Insecurity (07/23/2021)   Hunger Vital Sign    Worried About Running Out of Food in the Last Year: Never true    Ran Out of Food in the Last Year: Never true  Transportation Needs: No Transportation Needs (07/23/2021)   PRAPARE - Administrator, Civil Service (Medical): No    Lack of Transportation (Non-Medical): No  Physical Activity: Inactive (07/23/2021)   Exercise Vital Sign    Days of Exercise per Week: 0 days    Minutes of Exercise per Session: 0 min  Stress: No Stress Concern Present (07/23/2021)   [employer] of Occupational Health - Occupational Stress Questionnaire    Feeling of Stress : Not at all  Social Connections: Moderately Integrated (07/23/2021)   Social Connection and Isolation Panel [NHANES]    Frequency of Communication with Friends and Family: More than three times a week    Frequency of Social Gatherings with Friends and Family: More than three times a week    Attends Religious Services: More  than 4 times per year    Active Member of Clubs or Organizations: Yes    Attends Archivist Meetings: More than 4 times per year    Marital Status: Widowed  Intimate Partner Violence: Not At Risk (07/23/2021)   Humiliation, Afraid, Rape, and Kick questionnaire    Fear of Current or Ex-Partner: No    Emotionally Abused: No    Physically Abused: No    Sexually Abused: No     Outpatient Medications Prior to Visit  Medication Sig Dispense Refill   amLODipine (NORVASC) 10 MG tablet Take 1 tablet (10 mg total) by mouth daily. 90 tablet 3   aspirin EC 81 MG tablet Take 81 mg by mouth daily.      benzonatate (TESSALON PERLES) 100 MG capsule Take 2 capsules (200 mg total) by mouth 3 (three) times daily as  needed for cough. 20 capsule 0   budesonide-formoterol (SYMBICORT) 80-4.5 MCG/ACT inhaler Inhale 2 puffs into the lungs 2 (two) times daily. 10.2 g 3   cloNIDine (CATAPRES) 0.1 MG tablet TAKE 1 TABLET (0.1 MG TOTAL) BY MOUTH DAILY AS NEEDED (BLOOD PRESSURE GREATER THAN 332 SYSTOLIC). 90 tablet 2   dextromethorphan (ROBITUSSIN 12 HOUR COUGH) 30 MG/5ML liquid Take 5 mLs (30 mg total) by mouth as needed for cough. 89 mL 0   esomeprazole (NEXIUM) 40 MG capsule Take 40 mg by mouth daily.      glipiZIDE (GLUCOTROL XL) 10 MG 24 hr tablet Take 1 tablet (10 mg total) by mouth daily. 90 tablet 5   glucose blood test strip Use as instructed 100 each 12   Insulin Lispro Prot & Lispro (HUMALOG MIX 75/25 KWIKPEN) (75-25) 100 UNIT/ML Kwikpen INJECT 15 UNITS INTO THE SKIN 2 (TWO) TIMES DAILY. 3 mL 1   Insulin Pen Needle (PEN NEEDLES) 31G X 8 MM MISC USE TO INJECT INSULIN AS DIRECTED 100 each 3   lovastatin (MEVACOR) 20 MG tablet TAKE 1 TABLET BY MOUTH EVERY DAY 90 tablet 1   metFORMIN (GLUCOPHAGE-XR) 500 MG 24 hr tablet TAKE 1 TABLET BY MOUTH DAILY 90 tablet 3   oxybutynin (DITROPAN) 5 MG tablet TAKE 1 TABLET BY MOUTH EVERY DAY 90 tablet 3   polyethylene glycol powder (GLYCOLAX/MIRALAX) 17 GM/SCOOP powder Take 17 g by mouth 2 (two) times daily as needed. 3350 g 1   PRILOSEC OTC 20 MG tablet TAKE 1 TABLET BY MOUTH TWICE A DAY 42 tablet 7   promethazine (PHENERGAN) 25 MG tablet TAKE 1 TABLET BY MOUTH TWICE A DAY 30 tablet 2   sulfamethoxazole-trimethoprim (BACTRIM) 400-80 MG tablet Take 1 tablet by mouth 2 (two) times daily. 10 tablet 0   traMADol (ULTRAM) 50 MG tablet Take 1 tablet (50 mg total) by mouth 2 (two) times daily. 60 tablet 1   valsartan-hydrochlorothiazide (DIOVAN-HCT) 320-25 MG tablet Take 1 tablet by mouth daily. 90 tablet 3   albuterol (PROAIR HFA) 108 (90 Base) MCG/ACT inhaler Inhale 2 puffs into the lungs every 6 (six) hours as needed.     No facility-administered medications prior to visit.     No Known Allergies  ROS Review of Systems  Constitutional: Negative.   HENT: Negative.    Respiratory:  Negative for chest tightness and shortness of breath.   Gastrointestinal: Negative.   Genitourinary: Negative.   Musculoskeletal:  Positive for arthralgias and gait problem.  Skin: Negative.   Psychiatric/Behavioral:  Negative for agitation, behavioral problems and confusion.       Objective:  Physical Exam Constitutional:      Appearance: Normal appearance. She is obese.  HENT:     Head: Normocephalic.     Right Ear: Tympanic membrane normal.     Left Ear: Tympanic membrane normal.     Nose: Nose normal.     Mouth/Throat:     Mouth: Mucous membranes are moist.     Pharynx: Oropharynx is clear.  Eyes:     Extraocular Movements: Extraocular movements intact.     Conjunctiva/sclera: Conjunctivae normal.     Pupils: Pupils are equal, round, and reactive to light.  Cardiovascular:     Rate and Rhythm: Normal rate and regular rhythm.     Pulses: Normal pulses.     Heart sounds: Normal heart sounds.  Pulmonary:     Effort: Pulmonary effort is normal. No respiratory distress.     Breath sounds: Normal breath sounds. No rhonchi.  Abdominal:     General: Bowel sounds are normal.     Palpations: Abdomen is soft. There is no mass.     Tenderness: There is no abdominal tenderness.     Hernia: No hernia is present.  Musculoskeletal:        General: Normal range of motion.     Cervical back: Neck supple. No tenderness.  Skin:    General: Skin is warm.     Capillary Refill: Capillary refill takes less than 2 seconds.  Neurological:     General: No focal deficit present.     Mental Status: She is alert and oriented to person, place, and time. Mental status is at baseline.  Psychiatric:        Mood and Affect: Mood normal.        Behavior: Behavior normal.        Thought Content: Thought content normal.        Judgment: Judgment normal.     BP 138/85   Pulse 78    Ht 5\' 4"  (1.626 m)   Wt 188 lb 12.8 oz (85.6 kg)   BMI 32.41 kg/m  Wt Readings from Last 3 Encounters:  06/06/22 188 lb 12.8 oz (85.6 kg)  04/10/22 192 lb 1 oz (87.1 kg)  01/16/22 201 lb 6.4 oz (91.4 kg)     Health Maintenance  Topic Date Due   FOOT EXAM  Never done   Diabetic kidney evaluation - Urine ACR  Never done   Diabetic kidney evaluation - GFR measurement  01/29/2022   Medicare Annual Wellness (AWV)  07/23/2022   Pneumonia Vaccine 1+ Years old (1 - PCV) 07/23/2022 (Originally 05/31/2006)   DEXA SCAN  07/23/2022 (Originally 05/31/2006)   TETANUS/TDAP  07/23/2022 (Originally 05/31/1960)   Zoster Vaccines- Shingrix (1 of 2) 09/06/2022 (Originally 06/01/1991)   INFLUENZA VACCINE  09/09/2022 (Originally 03/11/2022)   COVID-19 Vaccine (1) 06/23/2023 (Originally 11/29/1941)   OPHTHALMOLOGY EXAM  10/30/2022   HEMOGLOBIN A1C  12/06/2022   HPV VACCINES  Aged Out    There are no preventive care reminders to display for this patient.  Lab Results  Component Value Date   TSH 0.47 01/29/2021   Lab Results  Component Value Date   WBC 8.0 01/29/2021   HGB 14.1 01/29/2021   HCT 44.1 01/29/2021   MCV 85.3 01/29/2021   PLT 257 01/29/2021   Lab Results  Component Value Date   NA 135 01/29/2021   K 4.2 01/29/2021   CO2 23 01/29/2021   GLUCOSE 269 (H) 01/29/2021   BUN 15 01/29/2021   CREATININE  0.69 01/29/2021   BILITOT 0.5 01/29/2021   ALKPHOS 103 08/18/2013   AST 17 01/29/2021   ALT 11 01/29/2021   PROT 7.0 01/29/2021   ALBUMIN 3.5 08/18/2013   CALCIUM 9.6 01/29/2021   ANIONGAP 16 (H) 07/01/2019   Lab Results  Component Value Date   CHOL 175 01/29/2021   Lab Results  Component Value Date   HDL 43 (L) 01/29/2021   Lab Results  Component Value Date   LDLCALC 112 (H) 01/29/2021   Lab Results  Component Value Date   TRIG 101 01/29/2021   Lab Results  Component Value Date   CHOLHDL 4.1 01/29/2021   Lab Results  Component Value Date   HGBA1C 10.1  06/06/2022      Assessment & Plan:   Problem List Items Addressed This Visit       Cardiovascular and Mediastinum   Primary hypertension     Endocrine   Type 2 diabetes mellitus with other specified complication (HCC) - Primary   Relevant Orders   POCT HgB A1C (Completed)   POCT glucose (manual entry) (Completed)     Musculoskeletal and Integument   Osteoarthritis of left knee     No orders of the defined types were placed in this encounter.    Follow-up: No follow-ups on file.    Kara Dies, NP

## 2022-06-06 NOTE — Progress Notes (Unsigned)
Established Patient Office Visit  Subjective:  Patient ID: Mary Hodges, female    DOB: Aug 25, 1940  Age: 81 y.o. MRN: [id number]  CC:  Chief Complaint  Patient presents with   Leg Pain    Patient needs refill of her tramadol. Patient has been out of it and leg and has been hurting.    Diabetes     HPI  Mary Hodges presents for:  HPI   Past Medical History:  Diagnosis Date   Arthritis    Asthma    GERD (gastroesophageal reflux disease)    HLD (hyperlipidemia)    HTN (hypertension)    Hypercholesteremia    Hypertension    Type 2 diabetes mellitus (HCC)     Past Surgical History:  Procedure Laterality Date   CATARACT EXTRACTION W/PHACO Right 01/19/2020   Procedure: CATARACT EXTRACTION PHACO AND INTRAOCULAR LENS PLACEMENT (IOC) RIGHT DIABETIC;  Surgeon: Galen Manila, MD;  Location: [facility];  Service: Ophthalmology;  Laterality: Right;  US1:18.4 CDE9.08 LOT [id number] H   CATARACT EXTRACTION W/PHACO Left 04/19/2020   Procedure: CATARACT EXTRACTION PHACO AND INTRAOCULAR LENS PLACEMENT (IOC) LEFT DIABETIC;  Surgeon: Galen Manila, MD;  Location: [facility];  Service: Ophthalmology;  Laterality: Left;  8.48 0:49.6   left toe surgery      Family History  Problem Relation Age of Onset   CAD Father    CVA Mother     Social History   Socioeconomic History   Marital status: Widowed    Spouse name: Not on file   Number of children: Not on file   Years of education: Not on file   Highest education level: Not on file  Occupational History   Not on file  Tobacco Use   Smoking status: Never   Smokeless tobacco: Never  Vaping Use   Vaping Use: Never used  Substance and Sexual Activity   Alcohol use: No    Alcohol/week: 0.0 standard drinks of alcohol   Drug use: No   Sexual activity: Not on file  Other Topics Concern   Not on file  Social History Narrative   Not on file   Social Determinants of Health   Financial Resource Strain: Low Risk   (07/23/2021)   Overall Financial Resource Strain (CARDIA)    Difficulty of Paying Living Expenses: Not hard at all  Food Insecurity: No Food Insecurity (07/23/2021)   Hunger Vital Sign    Worried About Running Out of Food in the Last Year: Never true    Ran Out of Food in the Last Year: Never true  Transportation Needs: No Transportation Needs (07/23/2021)   PRAPARE - Administrator, Civil Service (Medical): No    Lack of Transportation (Non-Medical): No  Physical Activity: Inactive (07/23/2021)   Exercise Vital Sign    Days of Exercise per Week: 0 days    Minutes of Exercise per Session: 0 min  Stress: No Stress Concern Present (07/23/2021)   [employer] of Occupational Health - Occupational Stress Questionnaire    Feeling of Stress : Not at all  Social Connections: Moderately Integrated (07/23/2021)   Social Connection and Isolation Panel [NHANES]    Frequency of Communication with Friends and Family: More than three times a week    Frequency of Social Gatherings with Friends and Family: More than three times a week    Attends Religious Services: More than 4 times per year    Active Member of [employer] or Organizations: Yes  Attends Banker Meetings: More than 4 times per year    Marital Status: Widowed  Intimate Partner Violence: Not At Risk (07/23/2021)   Humiliation, Afraid, Rape, and Kick questionnaire    Fear of Current or Ex-Partner: No    Emotionally Abused: No    Physically Abused: No    Sexually Abused: No     Outpatient Medications Prior to Visit  Medication Sig Dispense Refill   amLODipine (NORVASC) 10 MG tablet Take 1 tablet (10 mg total) by mouth daily. 90 tablet 3   aspirin EC 81 MG tablet Take 81 mg by mouth daily.      benzonatate (TESSALON PERLES) 100 MG capsule Take 2 capsules (200 mg total) by mouth 3 (three) times daily as needed for cough. 20 capsule 0   budesonide-formoterol (SYMBICORT) 80-4.5 MCG/ACT inhaler Inhale 2 puffs  into the lungs 2 (two) times daily. 10.2 g 3   cloNIDine (CATAPRES) 0.1 MG tablet TAKE 1 TABLET (0.1 MG TOTAL) BY MOUTH DAILY AS NEEDED (BLOOD PRESSURE GREATER THAN 180 SYSTOLIC). 90 tablet 2   dextromethorphan (ROBITUSSIN 12 HOUR COUGH) 30 MG/5ML liquid Take 5 mLs (30 mg total) by mouth as needed for cough. 89 mL 0   esomeprazole (NEXIUM) 40 MG capsule Take 40 mg by mouth daily.      glipiZIDE (GLUCOTROL XL) 10 MG 24 hr tablet Take 1 tablet (10 mg total) by mouth daily. 90 tablet 5   glucose blood test strip Use as instructed 100 each 12   Insulin Lispro Prot & Lispro (HUMALOG MIX 75/25 KWIKPEN) (75-25) 100 UNIT/ML Kwikpen INJECT 15 UNITS INTO THE SKIN 2 (TWO) TIMES DAILY. 3 mL 1   Insulin Pen Needle (PEN NEEDLES) 31G X 8 MM MISC USE TO INJECT INSULIN AS DIRECTED 100 each 3   lovastatin (MEVACOR) 20 MG tablet TAKE 1 TABLET BY MOUTH EVERY DAY 90 tablet 1   metFORMIN (GLUCOPHAGE-XR) 500 MG 24 hr tablet TAKE 1 TABLET BY MOUTH DAILY 90 tablet 3   oxybutynin (DITROPAN) 5 MG tablet TAKE 1 TABLET BY MOUTH EVERY DAY 90 tablet 3   polyethylene glycol powder (GLYCOLAX/MIRALAX) 17 GM/SCOOP powder Take 17 g by mouth 2 (two) times daily as needed. 3350 g 1   PRILOSEC OTC 20 MG tablet TAKE 1 TABLET BY MOUTH TWICE A DAY 42 tablet 7   promethazine (PHENERGAN) 25 MG tablet TAKE 1 TABLET BY MOUTH TWICE A DAY 30 tablet 2   sulfamethoxazole-trimethoprim (BACTRIM) 400-80 MG tablet Take 1 tablet by mouth 2 (two) times daily. 10 tablet 0   traMADol (ULTRAM) 50 MG tablet Take 1 tablet (50 mg total) by mouth 2 (two) times daily. 60 tablet 1   valsartan-hydrochlorothiazide (DIOVAN-HCT) 320-25 MG tablet Take 1 tablet by mouth daily. 90 tablet 3   albuterol (PROAIR HFA) 108 (90 Base) MCG/ACT inhaler Inhale 2 puffs into the lungs every 6 (six) hours as needed.     No facility-administered medications prior to visit.    No Known Allergies  ROS Review of Systems    Objective:    Physical Exam  BP 138/85   Pulse  78   Ht 5\' 4"  (1.626 m)   Wt 188 lb 12.8 oz (85.6 kg)   BMI 32.41 kg/m  Wt Readings from Last 3 Encounters:  06/06/22 188 lb 12.8 oz (85.6 kg)  04/10/22 192 lb 1 oz (87.1 kg)  01/16/22 201 lb 6.4 oz (91.4 kg)     Health Maintenance  Topic Date Due  FOOT EXAM  Never done   Diabetic kidney evaluation - Urine ACR  Never done   Diabetic kidney evaluation - GFR measurement  01/29/2022   Medicare Annual Wellness (AWV)  07/23/2022   Pneumonia Vaccine 61+ Years old (1 - PCV) 07/23/2022 (Originally 05/31/2006)   DEXA SCAN  07/23/2022 (Originally 05/31/2006)   TETANUS/TDAP  07/23/2022 (Originally 05/31/1960)   Zoster Vaccines- Shingrix (1 of 2) 09/06/2022 (Originally 06/01/1991)   INFLUENZA VACCINE  09/09/2022 (Originally 03/11/2022)   COVID-19 Vaccine (1) 06/23/2023 (Originally 11/29/1941)   OPHTHALMOLOGY EXAM  10/30/2022   HEMOGLOBIN A1C  12/06/2022   HPV VACCINES  Aged Out    There are no preventive care reminders to display for this patient.  Lab Results  Component Value Date   TSH 0.47 01/29/2021   Lab Results  Component Value Date   WBC 8.0 01/29/2021   HGB 14.1 01/29/2021   HCT 44.1 01/29/2021   MCV 85.3 01/29/2021   PLT 257 01/29/2021   Lab Results  Component Value Date   NA 135 01/29/2021   K 4.2 01/29/2021   CO2 23 01/29/2021   GLUCOSE 269 (H) 01/29/2021   BUN 15 01/29/2021   CREATININE 0.69 01/29/2021   BILITOT 0.5 01/29/2021   ALKPHOS 103 08/18/2013   AST 17 01/29/2021   ALT 11 01/29/2021   PROT 7.0 01/29/2021   ALBUMIN 3.5 08/18/2013   CALCIUM 9.6 01/29/2021   ANIONGAP 16 (H) 07/01/2019   Lab Results  Component Value Date   CHOL 175 01/29/2021   Lab Results  Component Value Date   HDL 43 (L) 01/29/2021   Lab Results  Component Value Date   LDLCALC 112 (H) 01/29/2021   Lab Results  Component Value Date   TRIG 101 01/29/2021   Lab Results  Component Value Date   CHOLHDL 4.1 01/29/2021   Lab Results  Component Value Date   HGBA1C 10.1  06/06/2022      Assessment & Plan:   Problem List Items Addressed This Visit       Endocrine   Type 2 diabetes mellitus with other specified complication ([place]) - Primary   Relevant Orders   POCT HgB A1C (Completed)   POCT glucose (manual entry) (Completed)     Other   Chronic bilateral low back pain without sciatica     No orders of the defined types were placed in this encounter.    Follow-up: No follow-ups on file.    Theresia Lo, NP

## 2022-06-10 NOTE — Assessment & Plan Note (Signed)
Patient BP 138/85 in the office today. Advised pt to follow a low sodium and heart healthy diet. Continue valsartan HCTZ and amlodipine

## 2022-06-10 NOTE — Assessment & Plan Note (Signed)
Stable with medication. Refilled tramadol.

## 2022-06-10 NOTE — Assessment & Plan Note (Signed)
Her POCT hemoglobin A1c 10.1 and blood sugar 201 in the office today. Advised patient to manage diet and follow regular physical activity routine. Continue the current medication.

## 2022-07-09 ENCOUNTER — Other Ambulatory Visit: Payer: Self-pay | Admitting: Internal Medicine

## 2022-07-09 DIAGNOSIS — E11610 Type 2 diabetes mellitus with diabetic neuropathic arthropathy: Secondary | ICD-10-CM

## 2022-07-19 ENCOUNTER — Other Ambulatory Visit: Payer: Self-pay | Admitting: Internal Medicine

## 2022-07-31 ENCOUNTER — Encounter: Payer: Self-pay | Admitting: Nurse Practitioner

## 2022-07-31 ENCOUNTER — Ambulatory Visit (INDEPENDENT_AMBULATORY_CARE_PROVIDER_SITE_OTHER): Payer: Medicare Other | Admitting: Nurse Practitioner

## 2022-07-31 VITALS — BP 148/88 | HR 74 | Ht 63.0 in | Wt 190.9 lb

## 2022-07-31 DIAGNOSIS — I10 Essential (primary) hypertension: Secondary | ICD-10-CM

## 2022-07-31 DIAGNOSIS — Z794 Long term (current) use of insulin: Secondary | ICD-10-CM

## 2022-07-31 DIAGNOSIS — Z7984 Long term (current) use of oral hypoglycemic drugs: Secondary | ICD-10-CM | POA: Diagnosis not present

## 2022-07-31 DIAGNOSIS — E1169 Type 2 diabetes mellitus with other specified complication: Secondary | ICD-10-CM | POA: Diagnosis not present

## 2022-07-31 DIAGNOSIS — M1712 Unilateral primary osteoarthritis, left knee: Secondary | ICD-10-CM | POA: Diagnosis not present

## 2022-07-31 LAB — POCT CBG (FASTING - GLUCOSE)-MANUAL ENTRY: Glucose Fasting, POC: 163 mg/dL — AB (ref 70–99)

## 2022-07-31 MED ORDER — TRAMADOL HCL 50 MG PO TABS: 50.0000 mg | ORAL_TABLET | Freq: Two times a day (BID) | ORAL | 0 refills | Status: DC

## 2022-07-31 MED ORDER — AMLODIPINE BESYLATE 10 MG PO TABS: 10.0000 mg | ORAL_TABLET | Freq: Every day | ORAL | 3 refills | Status: DC

## 2022-07-31 NOTE — Progress Notes (Signed)
Established Patient Office Visit  Subjective:  Patient ID: Mary Hodges, female    DOB: [birth year]  Age: 81 y.o. MRN: [id number]  CC:  Chief Complaint  Patient presents with   Diabetes    2 month dm FU     HPI  Mary Hodges presents for blood pressure check and refill of medication. Patient is accompanied by her son.  She has history of arthritis, hypertension, hyperlipidemia, diabetes, asthma and GERD.   Past Medical History:  Diagnosis Date   Arthritis    Asthma    GERD (gastroesophageal reflux disease)    HLD (hyperlipidemia)    HTN (hypertension)    Hypercholesteremia    Hypertension    Type 2 diabetes mellitus ([place])     Past Surgical History:  Procedure Laterality Date   CATARACT EXTRACTION W/PHACO Right 01/19/2020   Procedure: CATARACT EXTRACTION PHACO AND INTRAOCULAR LENS PLACEMENT ([place]) RIGHT DIABETIC;  Surgeon: Birder Robson, MD;  Location: [facility];  Service: Ophthalmology;  Laterality: Right;  US1:18.4 CDE9.08 LOT [id number] H   CATARACT EXTRACTION W/PHACO Left 04/19/2020   Procedure: CATARACT EXTRACTION PHACO AND INTRAOCULAR LENS PLACEMENT ([place]) LEFT DIABETIC;  Surgeon: Birder Robson, MD;  Location: [place];  Service: Ophthalmology;  Laterality: Left;  8.48 0:49.6   left toe surgery      Family History  Problem Relation Age of Onset   CAD Father    CVA Mother     Social History   Socioeconomic History   Marital status: Widowed    Spouse name: Not on file   Number of children: Not on file   Years of education: Not on file   Highest education level: Not on file  Occupational History   Not on file  Tobacco Use   Smoking status: Never   Smokeless tobacco: Never  Vaping Use   Vaping Use: Never used  Substance and Sexual Activity   Alcohol use: No    Alcohol/week: 0.0 standard drinks of alcohol   Drug use: No   Sexual activity: Not on file  Other Topics Concern   Not on file  Social History Narrative   Not on file    Social Determinants of Health   Financial Resource Strain: Low Risk  (07/23/2021)   Overall Financial Resource Strain (CARDIA)    Difficulty of Paying Living Expenses: Not hard at all  Food Insecurity: No Food Insecurity (07/23/2021)   Hunger Vital Sign    Worried About Running Out of Food in the Last Year: Never true    [place] in the Last Year: Never true  Transportation Needs: No Transportation Needs (07/23/2021)   PRAPARE - Hydrologist (Medical): No    Lack of Transportation (Non-Medical): No  Physical Activity: Inactive (07/23/2021)   Exercise Vital Sign    Days of Exercise per Week: 0 days    Minutes of Exercise per Session: 0 min  Stress: No Stress Concern Present (07/23/2021)   [place]    Feeling of Stress : Not at all  Social Connections: Moderately Integrated (07/23/2021)   Social Connection and Isolation Panel [NHANES]    Frequency of Communication with Friends and Family: More than three times a week    Frequency of Social Gatherings with Friends and Family: More than three times a week    Attends Religious Services: More than 4 times per year    Active Member of [employer] or Organizations:  Yes    Attends Club or Organization Meetings: More than 4 times per year    Marital Status: Widowed  Intimate Partner Violence: Not At Risk (07/23/2021)   Humiliation, Afraid, Rape, and Kick questionnaire    Fear of Current or Ex-Partner: No    Emotionally Abused: No    Physically Abused: No    Sexually Abused: No     Outpatient Medications Prior to Visit  Medication Sig Dispense Refill   aspirin EC 81 MG tablet Take 81 mg by mouth daily.      budesonide-formoterol (SYMBICORT) 80-4.5 MCG/ACT inhaler Inhale 2 puffs into the lungs 2 (two) times daily. 10.2 g 3   cloNIDine (CATAPRES) 0.1 MG tablet TAKE 1 TABLET (0.1 MG TOTAL) BY MOUTH DAILY AS NEEDED (BLOOD PRESSURE GREATER  THAN 099 SYSTOLIC). 90 tablet 2   esomeprazole (NEXIUM) 40 MG capsule Take 40 mg by mouth daily.      glipiZIDE (GLUCOTROL XL) 10 MG 24 hr tablet Take 1 tablet (10 mg total) by mouth daily. 90 tablet 5   glucose blood test strip Use as instructed 100 each 12   Insulin Lispro Prot & Lispro (HUMALOG MIX 75/25 KWIKPEN) (75-25) 100 UNIT/ML Kwikpen INJECT 15 UNITS INTO THE SKIN 2 (TWO) TIMES DAILY. 3 mL 1   Insulin Pen Needle (PEN NEEDLES) 31G X 8 MM MISC USE TO INJECT INSULIN AS DIRECTED 100 each 3   lovastatin (MEVACOR) 20 MG tablet TAKE 1 TABLET BY MOUTH EVERY DAY 90 tablet 1   metFORMIN (GLUCOPHAGE-XR) 500 MG 24 hr tablet TAKE 1 TABLET BY MOUTH DAILY 90 tablet 3   oxybutynin (DITROPAN) 5 MG tablet TAKE 1 TABLET BY MOUTH EVERY DAY 90 tablet 3   polyethylene glycol powder (GLYCOLAX/MIRALAX) 17 GM/SCOOP powder Take 17 g by mouth 2 (two) times daily as needed. 3350 g 1   promethazine (PHENERGAN) 25 MG tablet TAKE 1 TABLET BY MOUTH TWICE A DAY 30 tablet 2   valsartan-hydrochlorothiazide (DIOVAN-HCT) 320-25 MG tablet Take 1 tablet by mouth daily. 90 tablet 3   amLODipine (NORVASC) 10 MG tablet Take 1 tablet (10 mg total) by mouth daily. 90 tablet 3   benzonatate (TESSALON PERLES) 100 MG capsule Take 2 capsules (200 mg total) by mouth 3 (three) times daily as needed for cough. 20 capsule 0   dextromethorphan (ROBITUSSIN 12 HOUR COUGH) 30 MG/5ML liquid Take 5 mLs (30 mg total) by mouth as needed for cough. 89 mL 0   PRILOSEC OTC 20 MG tablet TAKE 1 TABLET BY MOUTH TWICE A DAY 42 tablet 7   sulfamethoxazole-trimethoprim (BACTRIM) 400-80 MG tablet Take 1 tablet by mouth 2 (two) times daily. 10 tablet 0   traMADol (ULTRAM) 50 MG tablet Take 1 tablet (50 mg total) by mouth 2 (two) times daily. 60 tablet 0   albuterol (PROAIR HFA) 108 (90 Base) MCG/ACT inhaler Inhale 2 puffs into the lungs every 6 (six) hours as needed.     No facility-administered medications prior to visit.    No Known  Allergies  ROS Review of Systems  Constitutional: Negative.   HENT: Negative.    Eyes: Negative.   Respiratory:  Negative for chest tightness and shortness of breath.   Cardiovascular:  Negative for chest pain and palpitations.  Gastrointestinal: Negative.   Genitourinary: Negative.   Musculoskeletal:  Positive for arthralgias and joint swelling.  Skin: Negative.   Neurological: Negative.   Psychiatric/Behavioral: Negative.        Objective:    Physical Exam Constitutional:  Appearance: Normal appearance. She is obese.  HENT:     Head: Normocephalic.     Right Ear: Tympanic membrane normal.     Left Ear: Tympanic membrane normal.     Nose: Nose normal.     Mouth/Throat:     Mouth: Mucous membranes are moist.     Pharynx: Oropharynx is clear.  Eyes:     Extraocular Movements: Extraocular movements intact.     Conjunctiva/sclera: Conjunctivae normal.     Pupils: Pupils are equal, round, and reactive to light.  Cardiovascular:     Rate and Rhythm: Normal rate and regular rhythm.     Pulses: Normal pulses.     Heart sounds: Normal heart sounds.  Pulmonary:     Effort: Pulmonary effort is normal. No respiratory distress.     Breath sounds: Normal breath sounds. No rhonchi.  Abdominal:     General: Bowel sounds are normal.     Palpations: Abdomen is soft. There is no mass.     Tenderness: There is no abdominal tenderness.     Hernia: No hernia is present.  Musculoskeletal:        General: No swelling or tenderness. Normal range of motion.     Cervical back: Neck supple. No tenderness.  Skin:    General: Skin is warm.     Capillary Refill: Capillary refill takes less than 2 seconds.  Neurological:     General: No focal deficit present.     Mental Status: She is alert and oriented to person, place, and time. Mental status is at baseline.  Psychiatric:        Mood and Affect: Mood normal.        Behavior: Behavior normal.        Thought Content: Thought content  normal.        Judgment: Judgment normal.     BP (!) 148/88   Pulse 74   Ht _0  (1.6 m)   Wt 190 lb 14.4 oz (86.6 kg)   SpO2 99%   BMI 33.82 kg/m  Wt Readings from Last 3 Encounters:  07/31/22 190 lb 14.4 oz (86.6 kg)  06/06/22 188 lb 12.8 oz (85.6 kg)  04/10/22 192 lb 1 oz (87.1 kg)     Health Maintenance  Topic Date Due   FOOT EXAM  Never done   Diabetic kidney evaluation - Urine ACR  Never done   DTaP/Tdap/Td (1 - Tdap) Never done   Pneumonia Vaccine 33+ Years old (1 - PCV) Never done   DEXA SCAN  Never done   Diabetic kidney evaluation - eGFR measurement  01/29/2022   Medicare Annual Wellness (AWV)  07/23/2022   Zoster Vaccines- Shingrix (1 of 2) 09/06/2022 (Originally 06/01/1991)   INFLUENZA VACCINE  09/09/2022 (Originally 03/11/2022)   COVID-19 Vaccine (1) 06/23/2023 (Originally 11/29/1941)   OPHTHALMOLOGY EXAM  10/30/2022   HEMOGLOBIN A1C  12/06/2022   HPV VACCINES  Aged Out    There are no preventive care reminders to display for this patient.  Lab Results  Component Value Date   TSH 0.47 01/29/2021   Lab Results  Component Value Date   WBC 8.0 01/29/2021   HGB 14.1 01/29/2021   HCT 44.1 01/29/2021   MCV 85.3 01/29/2021   PLT 257 01/29/2021   Lab Results  Component Value Date   NA 135 01/29/2021   K 4.2 01/29/2021   CO2 23 01/29/2021   GLUCOSE 269 (H) 01/29/2021   BUN 15 01/29/2021   CREATININE  0.69 01/29/2021   BILITOT 0.5 01/29/2021   ALKPHOS 103 08/18/2013   AST 17 01/29/2021   ALT 11 01/29/2021   PROT 7.0 01/29/2021   ALBUMIN 3.5 08/18/2013   CALCIUM 9.6 01/29/2021   ANIONGAP 16 (H) 07/01/2019   Lab Results  Component Value Date   CHOL 175 01/29/2021   Lab Results  Component Value Date   HDL 43 (L) 01/29/2021   Lab Results  Component Value Date   LDLCALC 112 (H) 01/29/2021   Lab Results  Component Value Date   TRIG 101 01/29/2021   Lab Results  Component Value Date   CHOLHDL 4.1 01/29/2021   Lab Results  Component  Value Date   HGBA1C 10.1 06/06/2022      Assessment & Plan:   Problem List Items Addressed This Visit       Cardiovascular and Mediastinum   Primary hypertension    Patient BP  Vitals:   07/31/22 1403 07/31/22 1405  BP: (!) 152/88 (!) 148/88    in the office today. Advised pt to follow a low sodium and heart healthy diet. Encourage patient to check blood pressure at home and bring readings to next appointment. Continue amlodipine and valsartan hctz, Encourage patient to bring the medication bag or list to the next appointment       Relevant Medications   amLODipine (NORVASC) 10 MG tablet     Endocrine   Diabetes mellitus ([place]) - Primary    Patient BS 163 in the office today. Advised pt to check the BS regularly, make a log and bring to next appointment.  Advised pt to eat variety of food including fruits, vegetables, whole grains, complex carbohydrates and proteins.  Continue metformin, glipizide and insulin       Relevant Orders   POCT CBG (Fasting - Glucose) (Completed)     Musculoskeletal and Integument   Osteoarthritis of left knee    Stable on medication.   Refilled tramadol      Relevant Medications   traMADol (ULTRAM) 50 MG tablet     Meds ordered this encounter  Medications   traMADol (ULTRAM) 50 MG tablet    Sig: Take 1 tablet (50 mg total) by mouth 2 (two) times daily.    Dispense:  60 tablet    Refill:  0   amLODipine (NORVASC) 10 MG tablet    Sig: Take 1 tablet (10 mg total) by mouth daily.    Dispense:  90 tablet    Refill:  3     Follow-up: No follow-ups on file.    Theresia Lo, NP

## 2022-08-02 ENCOUNTER — Encounter: Payer: Self-pay | Admitting: Nurse Practitioner

## 2022-08-02 NOTE — Assessment & Plan Note (Addendum)
Stable on medication. Refilled tramadol. 

## 2022-08-02 NOTE — Assessment & Plan Note (Signed)
Patient BS 163 in the office today. Advised pt to check the BS regularly, make a log and bring to next appointment.  Advised pt to eat variety of food including fruits, vegetables, whole grains, complex carbohydrates and proteins.  Continue metformin, glipizide and insulin

## 2022-08-02 NOTE — Assessment & Plan Note (Signed)
Patient BP  Vitals:   07/31/22 1403 07/31/22 1405  BP: (!) 152/88 (!) 148/88    in the office today. Advised pt to follow a low sodium and heart healthy diet. Encourage patient to check blood pressure at home and bring readings to next appointment. Continue amlodipine and valsartan hctz, Encourage patient to bring the medication bag or list to the next appointment

## 2022-08-05 NOTE — Progress Notes (Unsigned)
[facility] Quality Team Note  Name: Mary Hodges Date of Birth: Dec 03, 1940 MRN: [id number] Date: 08/05/2022  [facility] Quality Team has reviewed this patient's chart, please see recommendations below:  [facility] Quality Other; Pt has open quality gap for KED, needs Micro/Creat Urine test to close this. Please address at next visit.

## 2022-08-21 ENCOUNTER — Ambulatory Visit: Payer: Medicare Other | Admitting: Nurse Practitioner

## 2022-09-12 ENCOUNTER — Other Ambulatory Visit: Payer: Self-pay | Admitting: Internal Medicine

## 2022-09-18 ENCOUNTER — Encounter: Payer: Medicare Other | Admitting: Nurse Practitioner

## 2022-09-25 DIAGNOSIS — R300 Dysuria: Secondary | ICD-10-CM | POA: Diagnosis not present

## 2022-09-25 DIAGNOSIS — E1140 Type 2 diabetes mellitus with diabetic neuropathy, unspecified: Secondary | ICD-10-CM | POA: Diagnosis not present

## 2022-09-25 DIAGNOSIS — R319 Hematuria, unspecified: Secondary | ICD-10-CM | POA: Diagnosis not present

## 2022-09-25 DIAGNOSIS — B351 Tinea unguium: Secondary | ICD-10-CM | POA: Diagnosis not present

## 2022-09-25 DIAGNOSIS — S91101A Unspecified open wound of right great toe without damage to nail, initial encounter: Secondary | ICD-10-CM | POA: Diagnosis not present

## 2022-09-25 DIAGNOSIS — N390 Urinary tract infection, site not specified: Secondary | ICD-10-CM | POA: Diagnosis not present

## 2022-09-25 DIAGNOSIS — L97511 Non-pressure chronic ulcer of other part of right foot limited to breakdown of skin: Secondary | ICD-10-CM | POA: Diagnosis not present

## 2022-10-27 DIAGNOSIS — E1140 Type 2 diabetes mellitus with diabetic neuropathy, unspecified: Secondary | ICD-10-CM | POA: Diagnosis not present

## 2022-10-27 DIAGNOSIS — L97511 Non-pressure chronic ulcer of other part of right foot limited to breakdown of skin: Secondary | ICD-10-CM | POA: Diagnosis not present

## 2022-10-29 ENCOUNTER — Other Ambulatory Visit: Payer: Self-pay | Admitting: Nurse Practitioner

## 2022-10-29 DIAGNOSIS — M1712 Unilateral primary osteoarthritis, left knee: Secondary | ICD-10-CM

## 2022-11-02 NOTE — Telephone Encounter (Signed)
Refill this time please advise the patient to make follow-up appointment.

## 2022-11-03 MED ORDER — TRAMADOL HCL 50 MG PO TABS: 50.0000 mg | ORAL_TABLET | Freq: Two times a day (BID) | ORAL | 0 refills | Status: DC

## 2023-01-13 ENCOUNTER — Emergency Department: Payer: Medicare Other

## 2023-01-13 ENCOUNTER — Other Ambulatory Visit: Payer: Self-pay

## 2023-01-13 ENCOUNTER — Inpatient Hospital Stay
Admission: EM | Admit: 2023-01-13 | Discharge: 2023-01-13 | DRG: 639 | Discharge status: Against Medical Advice | Payer: Medicare Other | Attending: Internal Medicine | Admitting: Internal Medicine

## 2023-01-13 DIAGNOSIS — E11628 Type 2 diabetes mellitus with other skin complications: Secondary | ICD-10-CM | POA: Diagnosis present

## 2023-01-13 DIAGNOSIS — Z79899 Other long term (current) drug therapy: Secondary | ICD-10-CM

## 2023-01-13 DIAGNOSIS — L089 Local infection of the skin and subcutaneous tissue, unspecified: Secondary | ICD-10-CM | POA: Diagnosis present

## 2023-01-13 DIAGNOSIS — Z8249 Family history of ischemic heart disease and other diseases of the circulatory system: Secondary | ICD-10-CM

## 2023-01-13 DIAGNOSIS — E876 Hypokalemia: Secondary | ICD-10-CM | POA: Diagnosis present

## 2023-01-13 DIAGNOSIS — E11621 Type 2 diabetes mellitus with foot ulcer: Principal | ICD-10-CM | POA: Diagnosis present

## 2023-01-13 DIAGNOSIS — Z7951 Long term (current) use of inhaled steroids: Secondary | ICD-10-CM | POA: Diagnosis not present

## 2023-01-13 DIAGNOSIS — Z9889 Other specified postprocedural states: Secondary | ICD-10-CM | POA: Diagnosis not present

## 2023-01-13 DIAGNOSIS — I10 Essential (primary) hypertension: Secondary | ICD-10-CM | POA: Diagnosis present

## 2023-01-13 DIAGNOSIS — E1151 Type 2 diabetes mellitus with diabetic peripheral angiopathy without gangrene: Secondary | ICD-10-CM | POA: Diagnosis present

## 2023-01-13 DIAGNOSIS — Z823 Family history of stroke: Secondary | ICD-10-CM

## 2023-01-13 DIAGNOSIS — Z7982 Long term (current) use of aspirin: Secondary | ICD-10-CM | POA: Diagnosis not present

## 2023-01-13 DIAGNOSIS — E1140 Type 2 diabetes mellitus with diabetic neuropathy, unspecified: Secondary | ICD-10-CM

## 2023-01-13 DIAGNOSIS — Z794 Long term (current) use of insulin: Secondary | ICD-10-CM

## 2023-01-13 DIAGNOSIS — J45909 Unspecified asthma, uncomplicated: Secondary | ICD-10-CM | POA: Diagnosis present

## 2023-01-13 DIAGNOSIS — E785 Hyperlipidemia, unspecified: Secondary | ICD-10-CM | POA: Diagnosis not present

## 2023-01-13 DIAGNOSIS — K219 Gastro-esophageal reflux disease without esophagitis: Secondary | ICD-10-CM | POA: Diagnosis present

## 2023-01-13 DIAGNOSIS — Z5329 Procedure and treatment not carried out because of patient's decision for other reasons: Secondary | ICD-10-CM | POA: Diagnosis present

## 2023-01-13 DIAGNOSIS — I739 Peripheral vascular disease, unspecified: Secondary | ICD-10-CM

## 2023-01-13 DIAGNOSIS — I70245 Atherosclerosis of native arteries of left leg with ulceration of other part of foot: Secondary | ICD-10-CM

## 2023-01-13 DIAGNOSIS — Z7984 Long term (current) use of oral hypoglycemic drugs: Secondary | ICD-10-CM

## 2023-01-13 DIAGNOSIS — E7800 Pure hypercholesterolemia, unspecified: Secondary | ICD-10-CM | POA: Diagnosis present

## 2023-01-13 DIAGNOSIS — L97529 Non-pressure chronic ulcer of other part of left foot with unspecified severity: Secondary | ICD-10-CM | POA: Diagnosis present

## 2023-01-13 LAB — BASIC METABOLIC PANEL
Anion gap: 12 (ref 5–15)
BUN: 19 mg/dL (ref 8–23)
CO2: 24 mmol/L (ref 22–32)
Calcium: 9 mg/dL (ref 8.9–10.3)
Chloride: 98 mmol/L (ref 98–111)
Creatinine, Ser: 0.89 mg/dL (ref 0.44–1.00)
GFR, Estimated: >60 mL/min (ref >60)
Glucose, Bld: 206 mg/dL — ABNORMAL HIGH (ref 70–99)
Potassium: 2.9 mmol/L — ABNORMAL LOW (ref 3.5–5.1)
Sodium: 134 mmol/L — ABNORMAL LOW (ref 135–145)

## 2023-01-13 LAB — CBC
HCT: 35 % — ABNORMAL LOW (ref 36.0–46.0)
Hemoglobin: 11.8 g/dL — ABNORMAL LOW (ref 12.0–15.0)
MCH: 27.2 pg (ref 26.0–34.0)
MCHC: 33.7 g/dL (ref 30.0–36.0)
MCV: 80.6 fL (ref 80.0–100.0)
Platelets: 294 10*3/uL (ref 150–400)
RBC: 4.34 MIL/uL (ref 3.87–5.11)
RDW: 12.2 % (ref 11.5–15.5)
WBC: 14.1 10*3/uL — ABNORMAL HIGH (ref 4.0–10.5)
nRBC: 0 % (ref 0.0–0.2)

## 2023-01-13 LAB — PROTIME-INR
INR: 1.2 (ref 0.8–1.2)
Prothrombin Time: 15.5 seconds — ABNORMAL HIGH (ref 11.4–15.2)

## 2023-01-13 LAB — APTT: aPTT: 33 seconds (ref 24–36)

## 2023-01-13 LAB — SEDIMENTATION RATE: Sed Rate: 59 mm/hr — ABNORMAL HIGH (ref 0–30)

## 2023-01-13 LAB — MAGNESIUM: Magnesium: 1.6 mg/dL — ABNORMAL LOW (ref 1.7–2.4)

## 2023-01-13 LAB — LACTIC ACID, PLASMA: Lactic Acid, Venous: 1.8 mmol/L (ref 0.5–1.9)

## 2023-01-13 MED ORDER — METRONIDAZOLE 500 MG/100ML IV SOLN: 500.0000 mg | Freq: Once | INTRAVENOUS | Status: DC

## 2023-01-13 MED ORDER — ONDANSETRON HCL 4 MG/2ML IJ SOLN: 4.0000 mg | Freq: Three times a day (TID) | INTRAMUSCULAR | Status: DC | PRN

## 2023-01-13 MED ORDER — VANCOMYCIN HCL IN DEXTROSE 1-5 GM/200ML-% IV SOLN: 1000.0000 mg | Freq: Once | INTRAVENOUS | Status: DC

## 2023-01-13 MED ORDER — INSULIN GLARGINE-YFGN 100 UNIT/ML ~~LOC~~ SOLN
28.0000 [IU] | Freq: Two times a day (BID) | SUBCUTANEOUS | Status: DC
  Filled 2023-01-13: qty 0.28

## 2023-01-13 MED ORDER — DM-GUAIFENESIN ER 30-600 MG PO TB12: 1.0000 | ORAL_TABLET | Freq: Two times a day (BID) | ORAL | Status: DC | PRN

## 2023-01-13 MED ORDER — ALBUTEROL SULFATE (2.5 MG/3ML) 0.083% IN NEBU: 3.0000 mL | INHALATION_SOLUTION | RESPIRATORY_TRACT | Status: DC | PRN

## 2023-01-13 MED ORDER — MORPHINE SULFATE (PF) 2 MG/ML IV SOLN: 1.0000 mg | INTRAVENOUS | Status: DC | PRN

## 2023-01-13 MED ORDER — MAGNESIUM SULFATE 2 GM/50ML IV SOLN: 2.0000 g | Freq: Once | INTRAVENOUS | Status: DC

## 2023-01-13 MED ORDER — HEPARIN SODIUM (PORCINE) 5000 UNIT/ML IJ SOLN: 5000.0000 [IU] | Freq: Three times a day (TID) | INTRAMUSCULAR | Status: DC

## 2023-01-13 MED ORDER — IRBESARTAN 150 MG PO TABS: 300.0000 mg | ORAL_TABLET | Freq: Every day | ORAL | Status: DC

## 2023-01-13 MED ORDER — POLYETHYLENE GLYCOL 3350 17 GM/SCOOP PO POWD: 17.0000 g | Freq: Two times a day (BID) | ORAL | Status: DC | PRN

## 2023-01-13 MED ORDER — MOMETASONE FURO-FORMOTEROL FUM 100-5 MCG/ACT IN AERO
2.0000 | INHALATION_SPRAY | Freq: Two times a day (BID) | RESPIRATORY_TRACT | Status: DC
  Filled 2023-01-13: qty 8.8

## 2023-01-13 MED ORDER — VALSARTAN-HYDROCHLOROTHIAZIDE 320-25 MG PO TABS: 1.0000 | ORAL_TABLET | Freq: Every day | ORAL | Status: DC

## 2023-01-13 MED ORDER — PANTOPRAZOLE SODIUM 40 MG PO TBEC: 40.0000 mg | DELAYED_RELEASE_TABLET | Freq: Every day | ORAL | Status: DC

## 2023-01-13 MED ORDER — INSULIN ASPART 100 UNIT/ML IJ SOLN: 0.0000 [IU] | Freq: Three times a day (TID) | INTRAMUSCULAR | Status: DC

## 2023-01-13 MED ORDER — ACETAMINOPHEN 325 MG PO TABS: 650.0000 mg | ORAL_TABLET | Freq: Four times a day (QID) | ORAL | Status: DC | PRN

## 2023-01-13 MED ORDER — HYDROCHLOROTHIAZIDE 25 MG PO TABS: 25.0000 mg | ORAL_TABLET | Freq: Every day | ORAL | Status: DC

## 2023-01-13 MED ORDER — METRONIDAZOLE 500 MG/100ML IV SOLN
500.0000 mg | Freq: Two times a day (BID) | INTRAVENOUS | Status: DC
  Filled 2023-01-13: qty 100

## 2023-01-13 MED ORDER — HYDRALAZINE HCL 20 MG/ML IJ SOLN: 5.0000 mg | INTRAMUSCULAR | Status: DC | PRN

## 2023-01-13 MED ORDER — SODIUM CHLORIDE 0.9 % IV SOLN
1.0000 g | INTRAVENOUS | Status: DC
  Filled 2023-01-13: qty 10

## 2023-01-13 MED ORDER — VANCOMYCIN HCL 1750 MG/350ML IV SOLN
1750.0000 mg | Freq: Once | INTRAVENOUS | Status: DC
  Filled 2023-01-13 (×2): qty 350

## 2023-01-13 MED ORDER — SODIUM CHLORIDE 0.9 % IV SOLN: 2.0000 g | Freq: Once | INTRAVENOUS | Status: DC

## 2023-01-13 MED ORDER — MUPIROCIN 2 % EX OINT
1.0000 | TOPICAL_OINTMENT | Freq: Two times a day (BID) | CUTANEOUS | Status: DC
  Filled 2023-01-13: qty 22

## 2023-01-13 MED ORDER — INSULIN ASPART 100 UNIT/ML IJ SOLN: 0.0000 [IU] | Freq: Every day | INTRAMUSCULAR | Status: DC

## 2023-01-13 MED ORDER — POTASSIUM CHLORIDE CRYS ER 20 MEQ PO TBCR
40.0000 meq | EXTENDED_RELEASE_TABLET | Freq: Once | ORAL | Status: AC
  Administered 2023-01-13: 40 meq via ORAL
  Filled 2023-01-13: qty 2

## 2023-01-13 MED ORDER — AMLODIPINE BESYLATE 5 MG PO TABS: 10.0000 mg | ORAL_TABLET | Freq: Every day | ORAL | Status: DC

## 2023-01-13 MED ORDER — POTASSIUM CHLORIDE CRYS ER 20 MEQ PO TBCR: 40.0000 meq | EXTENDED_RELEASE_TABLET | Freq: Once | ORAL | Status: DC

## 2023-01-13 MED ORDER — ASPIRIN 81 MG PO TBEC: 162.0000 mg | DELAYED_RELEASE_TABLET | Freq: Every day | ORAL | Status: DC

## 2023-01-13 MED ORDER — OXYCODONE-ACETAMINOPHEN 5-325 MG PO TABS: 1.0000 | ORAL_TABLET | ORAL | Status: DC | PRN

## 2023-01-13 MED ORDER — OXYBUTYNIN CHLORIDE 5 MG PO TABS: 5.0000 mg | ORAL_TABLET | Freq: Every day | ORAL | Status: DC

## 2023-01-13 NOTE — Discharge Summary (Signed)
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)   Primary hypertension   HLD (hyperlipidemia)   Hypokalemia   Hypomagnesemia   Asthma   PAD (peripheral artery disease) (HCC)   Principal Problem:   Diabetic foot infection (HCC) Active Problems:   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 FINDINGS: There is no evidence of fracture or dislocation. There is subcutaneous gas in the soft tissues overlying the great toe proximal and distal phalanx. No definite evidence of cortical erosion at this time to suggest osteomyelitis. No radiopaque foreign body. IMPRESSION: Subcutaneous gas in the soft tissues overlying the great toe proximal and distal phalanx, concerning for infection. No definite evidence of cortical erosion at this time to definitively suggest osteomyelitis.  Electronically Signed   By: Lorenza Cambridge M.D.   On: 01/13/2023 15:31      Discharge Exam: Vitals:   01/13/23 1443  BP: (!) 158/64  Pulse: 73  Resp: 17  Temp: 98 F (36.7 C)  SpO2: 100%   Vitals:   01/13/23 1443  BP: (!) 158/64  Pulse: 73  Resp: 17  Temp: 98 F (36.7 C)  SpO2: 100%    General: Not in acute distress HEENT:       Eyes: PERRL, EOMI, no scleral icterus.       ENT: No discharge from the ears and nose, no pharynx injection, no tonsillar enlargement.        Neck: No JVD, no bruit, no mass felt. Heme: No neck lymph node enlargement. Cardiac: S1/S2, RRR, No murmurs, No gallops or rubs. Respiratory: No rales, wheezing, rhonchi or rubs. GI: Soft, nondistended, nontender, no rebound pain, no organomegaly, BS present. GU: No hematuria Ext: No pitting leg edema bilaterally. 1+DP/PT pulse bilaterally. Musculoskeletal: No joint deformities, No joint redness or warmth, no limitation of ROM in spin. Skin: No rashes.  Neuro: Alert, oriented X3, cranial nerves II-XII grossly intact, moves all extremities normally. Muscle strength 5/5 in all extremities, sensation to light touch intact. Brachial reflex 2+ bilaterally. Knee reflex 1+ bilaterally. Negative Babinski's sign. Normal finger to nose test. Psych: Patient is not psychotic, no suicidal or hemocidal ideation.     The results of significant diagnostics from this hospitalization (including imaging, microbiology, ancillary and laboratory) are listed below for reference.     Microbiology: No results found for this or any previous visit (from the  past 240 hour(s)).   Labs: BNP (last 3 results) No results for input(s): "BNP" in the last 8760 hours. Basic Metabolic Panel: Recent Labs  Lab 01/13/23 1444  NA 134*  K 2.9*  CL 98  CO2 24  GLUCOSE 206*  BUN 19  CREATININE 0.89  CALCIUM 9.0  MG 1.6*   Liver Function Tests: No results for input(s): "AST", "ALT", "ALKPHOS", "BILITOT", "PROT", "ALBUMIN" in the  last 168 hours. No results for input(s): "LIPASE", "AMYLASE" in the last 168 hours. No results for input(s): "AMMONIA" in the last 168 hours. CBC: Recent Labs  Lab 01/13/23 1444  WBC 14.1*  HGB 11.8*  HCT 35.0*  MCV 80.6  PLT 294   Cardiac Enzymes: No results for input(s): "CKTOTAL", "CKMB", "CKMBINDEX", "TROPONINI" in the last 168 hours. BNP: Invalid input(s): "POCBNP" CBG: No results for input(s): "GLUCAP" in the last 168 hours. D-Dimer No results for input(s): "DDIMER" in the last 72 hours. Hgb A1c No results for input(s): "HGBA1C" in the last 72 hours. Lipid Profile No results for input(s): "CHOL", "HDL", "LDLCALC", "TRIG", "CHOLHDL", "LDLDIRECT" in the last 72 hours. Thyroid function studies No results for input(s): "TSH", "T4TOTAL", "T3FREE", "THYROIDAB" in the last 72 hours.  Invalid input(s): "FREET3" Anemia work up No results for input(s): "VITAMINB12", "FOLATE", "FERRITIN", "TIBC", "IRON", "RETICCTPCT" in the last 72 hours. Urinalysis    Component Value Date/Time   COLORURINE YELLOW (A) 07/02/2019 0317   APPEARANCEUR CLOUDY (A) 07/02/2019 0317   LABSPEC 1.017 07/02/2019 0317   PHURINE 5.0 07/02/2019 0317   GLUCOSEU >=500 (A) 07/02/2019 0317   HGBUR LARGE (A) 07/02/2019 0317   BILIRUBINUR Negative 10/21/2021 1111   KETONESUR 20 (A) 07/02/2019 0317   PROTEINUR Negative 10/21/2021 1111   PROTEINUR 100 (A) 07/02/2019 0317   UROBILINOGEN 0.2 10/21/2021 1111   NITRITE Negative 10/21/2021 1111   NITRITE NEGATIVE 07/02/2019 0317   LEUKOCYTESUR Large (3+) (A) 10/21/2021 1111   LEUKOCYTESUR LARGE (A) 07/02/2019 0317   Sepsis Labs Recent Labs  Lab 01/13/23 1444  WBC 14.1*   Microbiology No results found for this or any previous visit (from the past 240 hour(s)).  Time coordinating discharge:  35 minutes. ***  SIGNED:  Lorretta Harp, MD [facility] 01/13/2023, 6:59 PM   If 7PM-7AM, please contact night-coverage [email or web address]

## 2023-01-13 NOTE — ED Provider Notes (Signed)
[facility] Provider Note    Event Date/Time   First MD Initiated Contact with Patient 01/13/23 1501     (approximate)   History   Toe Pain   HPI  Mary Hodges is a 82 y.o. female with history of HTN, T2DM presenting to the emergency department for evaluation of left foot ulcer.  A few weeks ago, patient began to notice an ulcer of her left great toe.  She was placed on doxycycline, ciprofloxacin, and bacitracin.  She had a follow-up appointment with podiatry yesterday.  There, she had debridement of her toenails and a dressing applied with plan for continued outpatient care.  However, patient was told that if her symptoms worsen, she should present to the ER.  Patient reports that since her visit she has noticed some darkening of the area around her ulcer as well as worsening pain.  In the setting of this, she presents to the ER.  No fevers.  No chest pain or shortness of breath.     Physical Exam   Triage Vital Signs: ED Triage Vitals  Enc Vitals Group     BP 01/13/23 1443 (!) 158/64     Pulse Rate 01/13/23 1443 73     Resp 01/13/23 1443 17     Temp 01/13/23 1443 98 F (36.7 C)     Temp src --      SpO2 01/13/23 1443 100 %     Weight --      Height --      Head Circumference --      Peak Flow --      Pain Score 01/13/23 1442 6     Pain Loc --      Pain Edu? --      Excl. in GC? --     Most recent vital signs: Vitals:   01/13/23 1443  BP: (!) 158/64  Pulse: 73  Resp: 17  Temp: 98 F (36.7 C)  SpO2: 100%     General: Awake, interactive  CV:  Regular rate, good peripheral perfusion.  Resp:  Lungs clear, unlabored respirations.  Abd:  Soft, nondistended.  Neuro:  Symmetric facial movement, fluid speech MSK:  1+ DP pulses bilaterally, confirmed presence of pulse via Doppler.  There is some darkening of the skin of the left leg compared to the contralateral side.  At the base of the left great toe, there is an area of ulceration with a  small area of black overlying area and some darkness of the skin surrounding the ulcer.  There is a shallow ulcer along the medial aspect of the left great toe.   ED Results / Procedures / Treatments   Labs (all labs ordered are listed, but only abnormal results are displayed) Labs Reviewed  CBC - Abnormal; Notable for the following components:      Result Value   WBC 14.1 (*)    Hemoglobin 11.8 (*)    HCT 35.0 (*)    All other components within normal limits  BASIC METABOLIC PANEL - Abnormal; Notable for the following components:   Sodium 134 (*)    Potassium 2.9 (*)    Glucose, Bld 206 (*)    All other components within normal limits  PROTIME-INR - Abnormal; Notable for the following components:   Prothrombin Time 15.5 (*)    All other components within normal limits  MAGNESIUM - Abnormal; Notable for the following components:   Magnesium 1.6 (*)    All  other components within normal limits  SEDIMENTATION RATE - Abnormal; Notable for the following components:   Sed Rate 59 (*)    All other components within normal limits  CULTURE, BLOOD (ROUTINE X 2)  CULTURE, BLOOD (ROUTINE X 2)  LACTIC ACID, PLASMA  APTT     EKG EKG independently reviewed interpreted by myself (ER attending) demonstrates:    RADIOLOGY Imaging independently reviewed and interpreted by myself demonstrates:  X-Milo Solana of the great toe demonstrates subcutaneous gas, no bony changes suggestive of osteomyelitis noted  PROCEDURES:  Critical Care performed: Yes, see critical care procedure note(s)  CRITICAL CARE Performed by: Trinna Post   Total critical care time: 30 minutes  Critical care time was exclusive of separately billable procedures and treating other patients.  Critical care was necessary to treat or prevent imminent or life-threatening deterioration.  Critical care was time spent personally by me on the following activities: development of treatment plan with patient and/or surrogate as well  as nursing, discussions with consultants, evaluation of patient's response to treatment, examination of patient, obtaining history from patient or surrogate, ordering and performing treatments and interventions, ordering and review of laboratory studies, ordering and review of radiographic studies, pulse oximetry and re-evaluation of patient's condition.     MEDICATIONS ORDERED IN ED: Medications  potassium chloride SA (KLOR-CON M) CR tablet 40 mEq (40 mEq Oral Given 01/13/23 1530)     IMPRESSION / MDM / ASSESSMENT AND PLAN / ED COURSE  I reviewed the triage vital signs and the nursing notes.  Differential diagnosis includes, but is not limited to, diabetic foot ulcer, consideration for development of osteomyelitis  Patient's presentation is most consistent with acute presentation with potential threat to life or bodily function.  82 year old female presenting with ongoing toe ulcer with worsening pain.  Lab work or from triage demonstrating new leukocytosis with WBC of 14.1.  Mild anemia noted at 11.8 without reported acute bleeding sources.  CMP notable for hypokalemia with K of 2.9.  Normal lactate at 1.8.  X-Germaine Shenker of from triage of the great toe pending, plan for podiatry consult following completion of this.   Clinical Course as of 01/14/23 0016  Tue Jan 13, 2023  1602 Case discussed with Dr. Ether Griffins with podiatry.  He did review the patient's x-Raygan Skarda.  He does recommend admission to the medicine service with vascular surgery consultation.  He will come evaluate the patient later today to determine further intervention is needed.  Does recommend proceeding with IV antibiotics. [NR]  1610 Case discussed with Dr. Gilda Crease with vascular surgery.  His service will evaluate the patient in consult.  Antibiotics including cefepime, Flagyl, vancomycin ordered as well as blood cultures.  Patient updated on plan for admission.  Will reach out to hospitalist team for admission. [NR]    Clinical Course  User Index [NR] Trinna Post, MD      FINAL CLINICAL IMPRESSION(S) / ED DIAGNOSES   Final diagnoses:  Diabetic ulcer of toe of left foot associated with diabetes mellitus due to underlying condition, unspecified ulcer stage (HCC)  Hypokalemia     Rx / DC Orders   ED Discharge Orders     None        Note:  This document was prepared using Dragon voice recognition software and may include unintentional dictation errors.   Trinna Post, MD 01/14/23 [phone]

## 2023-01-13 NOTE — Consult Note (Signed)
PHARMACY -  BRIEF ANTIBIOTIC NOTE   Pharmacy has received consult(s) for cefepime and vancomycin dosing from an ED provider.  The patient's profile has been reviewed for ht/wt/allergies/indication/available labs.    One time order(s) placed for vancomycin 1 gram IV x 1 and cefepime 2 grams IV x 1  Further antibiotics/pharmacy consults should be ordered by admitting physician if indicated.                       Thank you, Barrie Folk, PharmD 01/13/2023  4:10 PM

## 2023-01-13 NOTE — ED Notes (Signed)
Dr Clyde Lundborg spoke with pt daughter at length and explained risks of leaving AMA. Daughter pt and sister insist on leaving. AMA form printed and signed by pt and this RN.

## 2023-01-13 NOTE — Consult Note (Signed)
Hospital Consult    Reason for Consult:  Diabetic Left Foot Ulcer Requesting Physician:  Dr Loleta Dicker MD MRN #:  [id number]  History of Present Illness: This is a 82 y.o. female with history of HTN, T2DM presenting to the emergency department for evaluation of left foot ulcer. A few weeks ago, patient began to notice an ulcer of her left great toe.  Patient was seen by Dr. Graciela Husbands of podiatry and started on Cipro and doxycycline on 01/08/2023 without any improvement.  On 01/12/2023 Dr. Graciela Husbands did a debridement of the left great toe.  She continues to have pain which is constant, sharp, nonradiating with numbness and tingling.  Patient denies any chest pain shortness of breath nausea vomiting diarrhea.  Patient's vitals all remained stable.  On exam this afternoon the patient's toe is edematous.  Also noted is to debridement that was done yesterday by podiatry.  She has a 1 cm x 1 and half centimeter round ulcer/wound.  Tissue is pink.  Foot was warm to touch.  Patient states right now her toes are numb and tingling.   Past Medical History:  Diagnosis Date   Arthritis    Asthma    GERD (gastroesophageal reflux disease)    HLD (hyperlipidemia)    HTN (hypertension)    Hypercholesteremia    Hypertension    Type 2 diabetes mellitus (HCC)     Past Surgical History:  Procedure Laterality Date   CATARACT EXTRACTION W/PHACO Right 01/19/2020   Procedure: CATARACT EXTRACTION PHACO AND INTRAOCULAR LENS PLACEMENT (IOC) RIGHT DIABETIC;  Surgeon: Galen Manila, MD;  Location: [facility] ORS;  Service: Ophthalmology;  Laterality: Right;  US1:18.4 CDE9.08 LOT [id number] H   CATARACT EXTRACTION W/PHACO Left 04/19/2020   Procedure: CATARACT EXTRACTION PHACO AND INTRAOCULAR LENS PLACEMENT (IOC) LEFT DIABETIC;  Surgeon: Galen Manila, MD;  Location: [facility];  Service: Ophthalmology;  Laterality: Left;  8.48 0:49.6   left toe surgery      No Known Allergies  Prior to Admission medications    Medication Sig Start Date End Date Taking? Authorizing Provider  amLODipine (NORVASC) 10 MG tablet Take 1 tablet (10 mg total) by mouth daily. 07/31/22  Yes Kara Dies, NP  aspirin EC 81 MG tablet Take 81 mg by mouth daily.    Yes [provider]  budesonide-formoterol (SYMBICORT) 80-4.5 MCG/ACT inhaler Inhale 2 puffs into the lungs 2 (two) times daily. 03/22/20  Yes Masoud, Renda Rolls, MD  ciprofloxacin (CIPRO) 500 MG tablet Take 1 tablet by mouth 2 (two) times daily. 01/08/23 01/15/23 Yes [provider]  cloNIDine (CATAPRES) 0.1 MG tablet TAKE 1 TABLET (0.1 MG TOTAL) BY MOUTH DAILY AS NEEDED (BLOOD PRESSURE GREATER THAN 180 SYSTOLIC). 07/21/22 07/18/2023 Yes Masoud, Renda Rolls, MD  doxycycline (VIBRA-TABS) 100 MG tablet Take 100 mg by mouth 2 (two) times daily. 01/08/23 01/18/23 Yes [provider]  esomeprazole (NEXIUM) 40 MG capsule Take 40 mg by mouth daily.    Yes [provider]  glipiZIDE (GLUCOTROL XL) 10 MG 24 hr tablet Take 1 tablet (10 mg total) by mouth daily. 03/05/21  Yes Masoud, Renda Rolls, MD  LEVEMIR FLEXPEN 100 UNIT/ML FlexPen Inject 70 Units into the skin daily. 01/01/23  Yes [provider]  lovastatin (MEVACOR) 20 MG tablet TAKE 1 TABLET BY MOUTH EVERY DAY 04/16/22  Yes Masoud, Renda Rolls, MD  metFORMIN (GLUCOPHAGE-XR) 500 MG 24 hr tablet TAKE 1 TABLET BY MOUTH DAILY 12/03/21  Yes Masoud, Renda Rolls, MD  mupirocin ointment (BACTROBAN) 2 % Apply 1 Application topically  2 (two) times daily. 01/08/23 01/15/23 Yes [provider]  oxybutynin (DITROPAN) 5 MG tablet TAKE 1 TABLET BY MOUTH EVERY DAY 04/17/22  Yes Masoud, Renda Rolls, MD  traMADol (ULTRAM) 50 MG tablet Take 1 tablet (50 mg total) by mouth 2 (two) times daily. 11/03/22  Yes Kara Dies, NP  valsartan-hydrochlorothiazide (DIOVAN-HCT) 320-25 MG tablet Take 1 tablet by mouth daily. 04/10/22  Yes Kara Dies, NP  glucose blood test strip Use as instructed 10/02/21   Corky Downs, MD  Insulin Lispro  Prot & Lispro (HUMALOG MIX 75/25 KWIKPEN) (75-25) 100 UNIT/ML Kwikpen INJECT 15 UNITS INTO THE SKIN 2 (TWO) TIMES DAILY. 10/07/21   Corky Downs, MD  Insulin Pen Needle (PEN NEEDLES) 31G X 8 MM MISC USE TO INJECT INSULIN AS DIRECTED 03/19/21   Corky Downs, MD  polyethylene glycol powder (GLYCOLAX/MIRALAX) 17 GM/SCOOP powder Take 17 g by mouth 2 (two) times daily as needed. 03/05/21   Corky Downs, MD  promethazine (PHENERGAN) 25 MG tablet TAKE 1 TABLET BY MOUTH TWICE A DAY 07/09/22   Corky Downs, MD    Social History   Socioeconomic History   Marital status: Widowed    Spouse name: Not on file   Number of children: Not on file   Years of education: Not on file   Highest education level: Not on file  Occupational History   Not on file  Tobacco Use   Smoking status: Never   Smokeless tobacco: Never  Vaping Use   Vaping Use: Never used  Substance and Sexual Activity   Alcohol use: No    Alcohol/week: 0.0 standard drinks of alcohol   Drug use: No   Sexual activity: Not on file  Other Topics Concern   Not on file  Social History Narrative   Not on file   Social Determinants of Health   Financial Resource Strain: Low Risk  (07/23/2021)   Overall Financial Resource Strain (CARDIA)    Difficulty of Paying Living Expenses: Not hard at all  Food Insecurity: No Food Insecurity (07/23/2021)   Hunger Vital Sign    Worried About Running Out of Food in the Last Year: Never true    Ran Out of Food in the Last Year: Never true  Transportation Needs: No Transportation Needs (07/23/2021)   PRAPARE - Administrator, Civil Service (Medical): No    Lack of Transportation (Non-Medical): No  Physical Activity: Inactive (07/23/2021)   Exercise Vital Sign    Days of Exercise per Week: 0 days    Minutes of Exercise per Session: 0 min  Stress: No Stress Concern Present (07/23/2021)   [employer] of Occupational Health - Occupational Stress Questionnaire    Feeling of Stress :  Not at all  Social Connections: Moderately Integrated (07/23/2021)   Social Connection and Isolation Panel [NHANES]    Frequency of Communication with Friends and Family: More than three times a week    Frequency of Social Gatherings with Friends and Family: More than three times a week    Attends Religious Services: More than 4 times per year    Active Member of [employer] or Organizations: Yes    Attends Banker Meetings: More than 4 times per year    Marital Status: Widowed  Intimate Partner Violence: Not At Risk (07/23/2021)   Humiliation, Afraid, Rape, and Kick questionnaire    Fear of Current or Ex-Partner: No    Emotionally Abused: No    Physically Abused: No    Sexually  Abused: No     Family History  Problem Relation Age of Onset   CAD Father    CVA Mother     ROS: Otherwise negative unless mentioned in HPI  Physical Examination  Vitals:   01/13/23 1443  BP: (!) 158/64  Pulse: 73  Resp: 17  Temp: 98 F (36.7 C)  SpO2: 100%   There is no height or weight on file to calculate BMI.  General:  WDWN in NAD Gait: Not observed HENT: WNL, normocephalic Pulmonary: normal non-labored breathing, without Rales, rhonchi,  wheezing Cardiac: regular, without  Murmurs, rubs or gallops; without carotid bruits Abdomen: Positive bowel sounds, soft, NT/ND, no masses Skin: without rashes Vascular Exam/Pulses: Bilateral lower extremities with palpable DP/PT pulses Extremities: without ischemic changes, without Gangrene , with cellulitis; with open wounds;  Musculoskeletal: no muscle wasting or atrophy  Neurologic: A&O X 3;  No focal weakness or paresthesias are detected; speech is fluent/normal Psychiatric:  The pt has Normal affect. Lymph:  Unremarkable  CBC    Component Value Date/Time   WBC 14.1 (H) 01/13/2023 1444   RBC 4.34 01/13/2023 1444   HGB 11.8 (L) 01/13/2023 1444   HGB 14.2 08/18/2013 1406   HCT 35.0 (L) 01/13/2023 1444   HCT 42.0 08/18/2013 1406    PLT 294 01/13/2023 1444   PLT 227 08/18/2013 1406   MCV 80.6 01/13/2023 1444   MCV 83 08/18/2013 1406   MCH 27.2 01/13/2023 1444   MCHC 33.7 01/13/2023 1444   RDW 12.2 01/13/2023 1444   RDW 12.8 08/18/2013 1406   LYMPHSABS 2,160 01/29/2021 1028   LYMPHSABS 3.0 08/28/2011 0434   MONOABS 0.6 12/23/2018 0936   MONOABS 0.6 08/28/2011 0434   EOSABS 744 (H) 01/29/2021 1028   EOSABS 0.2 08/28/2011 0434   BASOSABS 88 01/29/2021 1028   BASOSABS 0.0 08/28/2011 0434    BMET    Component Value Date/Time   NA 134 (L) 01/13/2023 1444   NA 134 (L) 08/18/2013 1406   K 2.9 (L) 01/13/2023 1444   K 3.8 08/18/2013 1406   CL 98 01/13/2023 1444   CL 101 08/18/2013 1406   CO2 24 01/13/2023 1444   CO2 27 08/18/2013 1406   GLUCOSE 206 (H) 01/13/2023 1444   GLUCOSE 374 (H) 08/18/2013 1406   BUN 19 01/13/2023 1444   BUN 12 08/18/2013 1406   CREATININE 0.89 01/13/2023 1444   CREATININE 0.69 01/29/2021 1028   CALCIUM 9.0 01/13/2023 1444   CALCIUM 8.8 08/18/2013 1406   GFRNONAA >60 01/13/2023 1444   GFRNONAA 83 01/29/2021 1028   GFRAA 96 01/29/2021 1028    COAGS: Lab Results  Component Value Date   INR 1.1 08/28/2011     Non-Invasive Vascular Imaging:   EXAM:01/13/2023 LEFT GREAT TOE 3 VIEW   COMPARISON:  None Available.   FINDINGS: There is no evidence of fracture or dislocation. There is subcutaneous gas in the soft tissues overlying the great toe proximal and distal phalanx. No definite evidence of cortical erosion at this time to suggest osteomyelitis. No radiopaque foreign body.   IMPRESSION: Subcutaneous gas in the soft tissues overlying the great toe proximal and distal phalanx, concerning for infection. No definite evidence of cortical erosion at this time to definitively suggest osteomyelitis.  Statin:  Yes.   Beta Blocker:  Yes.   Aspirin:  Yes.   ACEI:  No. ARB:  Yes.   CCB use:  No Other antiplatelets/anticoagulants:  No.    ASSESSMENT/PLAN: This is a 82 y.o.  female who presents to [facility] emergency department after a left great toe debridement yesterday in the podiatrist office by Dr. Graciela Husbands.  X-rays were taken and show areas of gas along the distal medial aspect of the great toe region.  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  Vascular surgery was consulted to evaluate patient's PVD.  PLAN: Vascular surgery plans on taking the patient to the vascular lab tomorrow 01/14/2023 for a left lower extremity angiogram with possible intervention.  I discussed in detail with the patient and her son who was on the phone the procedure, benefits, risks, and complications.  They both verbalized her understanding.  I answered all of their questions this afternoon.  Patient's son states he would like [place] to save the patient's toe if possible.  I informed him I would make note of that.  Patient will be made n.p.o. after midnight.   -I discussed the plan in detail with Dr. Levora Dredge MD he is in agreement with the plan.   Marcie Bal [facility] 01/13/2023 4:14 PM

## 2023-01-13 NOTE — Consult Note (Signed)
ORTHOPAEDIC CONSULTATION  REQUESTING PHYSICIAN: Lorretta Harp, MD  Chief Complaint: Infection left great toe  HPI: Mary Hodges is a 82 y.o. female who complains of worsening pain with discoloration to her left great toe.  Well-known to the podiatry clinic.  Was seen just yesterday for debridement of an ulceration.  Apparently this became more symptomatic overnight.  Presented to the ER.  X-rays taken that showed areas of gas along the distal and medial aspect of the great toe region.  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  Past Medical History:  Diagnosis Date   Arthritis    Asthma    GERD (gastroesophageal reflux disease)    HLD (hyperlipidemia)    HTN (hypertension)    Hypercholesteremia    Hypertension    Type 2 diabetes mellitus (HCC)    Past Surgical History:  Procedure Laterality Date   CATARACT EXTRACTION W/PHACO Right 01/19/2020   Procedure: CATARACT EXTRACTION PHACO AND INTRAOCULAR LENS PLACEMENT (IOC) RIGHT DIABETIC;  Surgeon: Galen Manila, MD;  Location: [facility];  Service: Ophthalmology;  Laterality: Right;  US1:18.4 CDE9.08 LOT [id number] H   CATARACT EXTRACTION W/PHACO Left 04/19/2020   Procedure: CATARACT EXTRACTION PHACO AND INTRAOCULAR LENS PLACEMENT (IOC) LEFT DIABETIC;  Surgeon: Galen Manila, MD;  Location: [facility];  Service: Ophthalmology;  Laterality: Left;  8.48 0:49.6   left toe surgery     Social History   Socioeconomic History   Marital status: Widowed    Spouse name: Not on file   Number of children: Not on file   Years of education: Not on file   Highest education level: Not on file  Occupational History   Not on file  Tobacco Use   Smoking status: Never   Smokeless tobacco: Never  Vaping Use   Vaping Use: Never used  Substance and Sexual Activity   Alcohol use: No    Alcohol/week: 0.0 standard drinks of alcohol   Drug use: No   Sexual activity: Not on file  Other Topics  Concern   Not on file  Social History Narrative   Not on file   Social Determinants of Health   Financial Resource Strain: Low Risk  (07/23/2021)   Overall Financial Resource Strain (CARDIA)    Difficulty of Paying Living Expenses: Not hard at all  Food Insecurity: No Food Insecurity (07/23/2021)   Hunger Vital Sign    Worried About Running Out of Food in the Last Year: Never true    Ran Out of Food in the Last Year: Never true  Transportation Needs: No Transportation Needs (07/23/2021)   PRAPARE - Administrator, Civil Service (Medical): No    Lack of Transportation (Non-Medical): No  Physical Activity: Inactive (07/23/2021)   Exercise Vital Sign    Days of Exercise per Week: 0 days    Minutes of Exercise per Session: 0 min  Stress: No Stress Concern Present (07/23/2021)   [employer] of Occupational Health - Occupational Stress Questionnaire    Feeling of Stress : Not at all  Social Connections: Moderately Integrated (07/23/2021)   Social Connection and Isolation Panel [NHANES]    Frequency of Communication with Friends and Family: More than three times a week    Frequency of Social Gatherings with Friends and Family: More than three times a week    Attends Religious Services: More than 4 times per year    Active Member of [employer] or Organizations: Yes    Attends Club or  Organization Meetings: More than 4 times per year    Marital Status: Widowed   Family History  Problem Relation Age of Onset   CAD Father    CVA Mother    No Known Allergies Prior to Admission medications   Medication Sig Start Date End Date Taking? Authorizing Provider  amLODipine (NORVASC) 10 MG tablet Take 1 tablet (10 mg total) by mouth daily. 07/31/22  Yes Kara Dies, NP  aspirin EC 81 MG tablet Take 162 mg by mouth daily.   Yes [provider]  budesonide-formoterol (SYMBICORT) 80-4.5 MCG/ACT inhaler Inhale 2 puffs into the lungs 2 (two) times daily. Patient taking  differently: Inhale 2 puffs into the lungs 2 (two) times daily as needed. 03/22/20  Yes Masoud, Renda Rolls, MD  ciprofloxacin (CIPRO) 500 MG tablet Take 1 tablet by mouth 2 (two) times daily. 01/08/23 01/15/23 Yes [provider]  cloNIDine (CATAPRES) 0.1 MG tablet TAKE 1 TABLET (0.1 MG TOTAL) BY MOUTH DAILY AS NEEDED (BLOOD PRESSURE GREATER THAN 180 SYSTOLIC). 07/21/22 07/27/2023 Yes Masoud, Renda Rolls, MD  doxycycline (VIBRA-TABS) 100 MG tablet Take 100 mg by mouth 2 (two) times daily. 01/08/23 01/18/23 Yes [provider]  esomeprazole (NEXIUM) 40 MG capsule Take 40 mg by mouth daily as needed.   Yes [provider]  LEVEMIR FLEXPEN 100 UNIT/ML FlexPen Inject 70 Units into the skin 2 (two) times daily. 35 units in the morning and 35 units in the evening 01/01/23  Yes [provider]  metFORMIN (GLUCOPHAGE-XR) 500 MG 24 hr tablet TAKE 1 TABLET BY MOUTH DAILY 12/03/21  Yes Masoud, Renda Rolls, MD  mupirocin ointment (BACTROBAN) 2 % Apply 1 Application topically 2 (two) times daily. 01/08/23 01/15/23 Yes [provider]  oxybutynin (DITROPAN) 5 MG tablet TAKE 1 TABLET BY MOUTH EVERY DAY 04/17/22  Yes Masoud, Renda Rolls, MD  polyethylene glycol powder (GLYCOLAX/MIRALAX) 17 GM/SCOOP powder Take 17 g by mouth 2 (two) times daily as needed. 03/05/21  Yes Masoud, Renda Rolls, MD  traMADol (ULTRAM) 50 MG tablet Take 1 tablet (50 mg total) by mouth 2 (two) times daily. 11/03/22  Yes Kara Dies, NP  valsartan-hydrochlorothiazide (DIOVAN-HCT) 320-25 MG tablet Take 1 tablet by mouth daily. 04/10/22  Yes Kara Dies, NP  glipiZIDE (GLUCOTROL XL) 10 MG 24 hr tablet Take 1 tablet (10 mg total) by mouth daily. Patient not taking: Reported on 01/13/2023 03/05/21   Corky Downs, MD  glucose blood test strip Use as instructed 10/02/21   Corky Downs, MD  Insulin Lispro Prot & Lispro (HUMALOG MIX 75/25 KWIKPEN) (75-25) 100 UNIT/ML Kwikpen INJECT 15 UNITS INTO THE SKIN 2 (TWO) TIMES DAILY. Patient not taking:  Reported on 01/13/2023 10/07/21   Corky Downs, MD  Insulin Pen Needle (PEN NEEDLES) 31G X 8 MM MISC USE TO INJECT INSULIN AS DIRECTED 03/19/21   Corky Downs, MD  lovastatin (MEVACOR) 20 MG tablet TAKE 1 TABLET BY MOUTH EVERY DAY Patient not taking: Reported on 01/13/2023 04/16/22   Corky Downs, MD  promethazine (PHENERGAN) 25 MG tablet TAKE 1 TABLET BY MOUTH TWICE A DAY Patient not taking: Reported on 01/13/2023 07/09/22   Corky Downs, MD   DG Toe Great Left  Result Date: 01/13/2023 CLINICAL DATA:  possible infection EXAM: LEFT GREAT TOE 3 VIEW COMPARISON:  None Available. FINDINGS: There is no evidence of fracture or dislocation. There is subcutaneous gas in the soft tissues overlying the great toe proximal and distal phalanx. No definite evidence of cortical erosion at this time to suggest osteomyelitis. No radiopaque  foreign body. IMPRESSION: Subcutaneous gas in the soft tissues overlying the great toe proximal and distal phalanx, concerning for infection. No definite evidence of cortical erosion at this time to definitively suggest osteomyelitis. Electronically Signed   By: Lorenza Cambridge M.D.   On: 01/13/2023 15:31    Positive ROS: All other systems have been reviewed and were otherwise negative with the exception of those mentioned in the HPI and as above.  12 point ROS was performed.  Physical Exam: General: Alert and oriented.  No apparent distress.  Vascular:  Left foot:Dorsalis Pedis:  absent Posterior Tibial:  absent  Right foot: Dorsalis Pedis:  absent Posterior Tibial:  absent  Neuro:absent protective sensation  Derm: To the plantar aspect of the left great toe there is a large ulcer that extends from the distal portion of the MTPJ to the interphalangeal joint of the great toe.  Probes deep and into the medial aspect of the tissue.  There is no purulent drainage that I was able to express today.  There was an area of soft boggy skin to the lateral aspect of the great toe with a small  wound that probes in this region.  A wound culture was performed from this region.  Ortho/MS: Edema to the left foot.  Assessment: Gas-producing infection left great toe Severe peripheral vascular disease with early necrosis left great toe Diabetes with neuropathy  Plan: Patient has an obvious ulceration with areas of darkened discoloration to the great toe.  At this point the preference would be to reestablish circulation prior to performing an amputation of the great toe to remove the infectious process.  It may take a partial first ray amputation in order to remove all of the nonviable tissue in the region.  At this point there is no purulent drainage.  Though there are areas of gas in the soft tissue, this is contained to the distal aspect of the great toe and there is an open wound that probes to allow any drainage from this wound.  For now we will continue with IV antibiotics.  Podiatry will continue to follow.  I have consulted vascular surgery at this time.     Irean Hong, DPM Cell [phone]   01/13/2023 5:02 PM

## 2023-01-13 NOTE — H&P (Addendum)
History and Physical    [name]:[id number] DOB: 1940/11/03 DOA: 01/13/2023  Referring MD/NP/PA:   PCP: Corky Downs, MD   Patient coming from:  The patient is coming from home.     Chief Complaint: left great toe ulcer   HPI: Mary Hodges is a 82 y.o. female with medical history significant of hypertension, hyperlipidemia, diabetes mellitus, asthma, GERD, who presents with left great toe ulcer.  Patient states she has left great toe ulcer for more than 3 weeks.  Patient has been following up with Dr. Alberteen Spindle of podiatry.  Her ulcer is infected, patient was started on Cipro and doxycycline on 5/30 without significant improvement.  Patient was seen by Dr. Alberteen Spindle yesterday, and had debridement of left great toe. Patient continues to have pain, which is constant, sharp, moderate, nonradiating.  Patient does not have fever or chills.  No chest pain, cough, shortness of breath.  No nausea, vomiting, diarrhea or abdominal pain.  No symptoms of UTI.  Data reviewed independently and ED Course: pt was found to have WBC 14.1, potassium 2.9, GFR> 60, temperature normal, blood pressure 158/64, heart rate 73, RR 17, oxygen saturation 100% on room air.  Patient is placed on MedSurg bed for observation. Dr. Ether Griffins of podiatry and Dr. Gilda Crease of [facility] are consulted.  X-ray of left great toe: Subcutaneous gas in the soft tissues overlying the great toe proximal and distal phalanx, concerning for infection. No definite evidence of cortical erosion at this time to definitively suggest osteomyelitis.   EKG:  Not done in ED, will get one.      Review of Systems:   General: no fevers, chills, no body weight gain, fatigue HEENT: no blurry vision, hearing changes or sore throat Respiratory: no dyspnea, coughing, wheezing CV: no chest pain, no palpitations GI: no nausea, vomiting, abdominal pain, diarrhea, constipation GU: no dysuria, burning on urination, increased urinary frequency, hematuria  Ext:  no leg edema Neuro: no unilateral weakness, numbness, or tingling, no vision change or hearing loss Skin: has left great toe ulcer with pain MSK: No muscle spasm, no deformity, no limitation of range of movement in spin Heme: No easy bruising.  Travel history: No recent long distant travel.   Allergy: No Known Allergies  Past Medical History:  Diagnosis Date   Arthritis    Asthma    GERD (gastroesophageal reflux disease)    HLD (hyperlipidemia)    HTN (hypertension)    Hypercholesteremia    Hypertension    Type 2 diabetes mellitus (HCC)     Past Surgical History:  Procedure Laterality Date   CATARACT EXTRACTION W/PHACO Right 01/19/2020   Procedure: CATARACT EXTRACTION PHACO AND INTRAOCULAR LENS PLACEMENT (IOC) RIGHT DIABETIC;  Surgeon: Galen Manila, MD;  Location: [facility];  Service: Ophthalmology;  Laterality: Right;  US1:18.4 CDE9.08 LOT [id number] H   CATARACT EXTRACTION W/PHACO Left 04/19/2020   Procedure: CATARACT EXTRACTION PHACO AND INTRAOCULAR LENS PLACEMENT (IOC) LEFT DIABETIC;  Surgeon: Galen Manila, MD;  Location: [facility];  Service: Ophthalmology;  Laterality: Left;  8.48 0:49.6   left toe surgery      Social History:  reports that she has never smoked. She has never used smokeless tobacco. She reports that she does not drink alcohol and does not use drugs.  Family History:  Family History  Problem Relation Age of Onset   CAD Father    CVA Mother      Prior to Admission medications   Medication Sig Start Date End  Date Taking? Authorizing Provider  amLODipine (NORVASC) 10 MG tablet Take 1 tablet (10 mg total) by mouth daily. 07/31/22  Yes Kara Dies, NP  aspirin EC 81 MG tablet Take 162 mg by mouth daily.   Yes [provider]  budesonide-formoterol (SYMBICORT) 80-4.5 MCG/ACT inhaler Inhale 2 puffs into the lungs 2 (two) times daily. Patient taking differently: Inhale 2 puffs into the lungs 2 (two) times daily as needed. 03/22/20   Yes Masoud, Renda Rolls, MD  ciprofloxacin (CIPRO) 500 MG tablet Take 1 tablet by mouth 2 (two) times daily. 01/08/23 01/15/23 Yes [provider]  cloNIDine (CATAPRES) 0.1 MG tablet TAKE 1 TABLET (0.1 MG TOTAL) BY MOUTH DAILY AS NEEDED (BLOOD PRESSURE GREATER THAN 180 SYSTOLIC). 07/21/22 07/26/2023 Yes Masoud, Renda Rolls, MD  doxycycline (VIBRA-TABS) 100 MG tablet Take 100 mg by mouth 2 (two) times daily. 01/08/23 01/18/23 Yes [provider]  esomeprazole (NEXIUM) 40 MG capsule Take 40 mg by mouth daily as needed.   Yes [provider]  LEVEMIR FLEXPEN 100 UNIT/ML FlexPen Inject 70 Units into the skin 2 (two) times daily. 35 units in the morning and 35 units in the evening 01/01/23  Yes [provider]  metFORMIN (GLUCOPHAGE-XR) 500 MG 24 hr tablet TAKE 1 TABLET BY MOUTH DAILY 12/03/21  Yes Masoud, Renda Rolls, MD  mupirocin ointment (BACTROBAN) 2 % Apply 1 Application topically 2 (two) times daily. 01/08/23 01/15/23 Yes [provider]  oxybutynin (DITROPAN) 5 MG tablet TAKE 1 TABLET BY MOUTH EVERY DAY 04/17/22  Yes Masoud, Renda Rolls, MD  polyethylene glycol powder (GLYCOLAX/MIRALAX) 17 GM/SCOOP powder Take 17 g by mouth 2 (two) times daily as needed. 03/05/21  Yes Masoud, Renda Rolls, MD  traMADol (ULTRAM) 50 MG tablet Take 1 tablet (50 mg total) by mouth 2 (two) times daily. 11/03/22  Yes Kara Dies, NP  valsartan-hydrochlorothiazide (DIOVAN-HCT) 320-25 MG tablet Take 1 tablet by mouth daily. 04/10/22  Yes Kara Dies, NP  glipiZIDE (GLUCOTROL XL) 10 MG 24 hr tablet Take 1 tablet (10 mg total) by mouth daily. Patient not taking: Reported on 01/13/2023 03/05/21   Corky Downs, MD  glucose blood test strip Use as instructed 10/02/21   Corky Downs, MD  Insulin Lispro Prot & Lispro (HUMALOG MIX 75/25 KWIKPEN) (75-25) 100 UNIT/ML Kwikpen INJECT 15 UNITS INTO THE SKIN 2 (TWO) TIMES DAILY. Patient not taking: Reported on 01/13/2023 10/07/21   Corky Downs, MD  Insulin Pen Needle (PEN  NEEDLES) 31G X 8 MM MISC USE TO INJECT INSULIN AS DIRECTED 03/19/21   Corky Downs, MD  lovastatin (MEVACOR) 20 MG tablet TAKE 1 TABLET BY MOUTH EVERY DAY Patient not taking: Reported on 01/13/2023 04/16/22   Corky Downs, MD  promethazine (PHENERGAN) 25 MG tablet TAKE 1 TABLET BY MOUTH TWICE A DAY Patient not taking: Reported on 01/13/2023 07/09/22   Corky Downs, MD    Physical Exam: Vitals:   01/13/23 1443  BP: (!) 158/64  Pulse: 73  Resp: 17  Temp: 98 F (36.7 C)  SpO2: 100%   General: Not in acute distress HEENT:       Eyes: PERRL, EOMI, no jaundice       ENT: No discharge from the ears and nose, no pharynx injection, no tonsillar enlargement.        Neck: No JVD, no bruit, no mass felt. Heme: No neck lymph node enlargement. Cardiac: S1/S2, RRR, No murmurs, No gallops or rubs. Respiratory: No rales, wheezing, rhonchi or rubs. GI: Soft, nondistended, nontender, no rebound pain, no organomegaly,  BS present. GU: No hematuria Ext: No pitting leg edema bilaterally. 1+DP/PT pulse bilaterally. Musculoskeletal: No joint deformities, No joint redness or warmth, no limitation of ROM in spin. Skin: Patient has a deep ulcer in left great toe, with surrounding darker color change, with tenderness   Neuro: Alert, oriented X3, cranial nerves II-XII grossly intact, moves all extremities normally. Psych: Patient is not psychotic, no suicidal or hemocidal ideation.  Labs on Admission: I have personally reviewed following labs and imaging studies  CBC: Recent Labs  Lab 01/13/23 1444  WBC 14.1*  HGB 11.8*  HCT 35.0*  MCV 80.6  PLT 294   Basic Metabolic Panel: Recent Labs  Lab 01/13/23 1444  NA 134*  K 2.9*  CL 98  CO2 24  GLUCOSE 206*  BUN 19  CREATININE 0.89  CALCIUM 9.0  MG 1.6*   GFR: CrCl cannot be calculated (Unknown ideal weight.). Liver Function Tests: No results for input(s): "AST", "ALT", "ALKPHOS", "BILITOT", "PROT", "ALBUMIN" in the last 168 hours. No results  for input(s): "LIPASE", "AMYLASE" in the last 168 hours. No results for input(s): "AMMONIA" in the last 168 hours. Coagulation Profile: No results for input(s): "INR", "PROTIME" in the last 168 hours. Cardiac Enzymes: No results for input(s): "CKTOTAL", "CKMB", "CKMBINDEX", "TROPONINI" in the last 168 hours. BNP (last 3 results) No results for input(s): "PROBNP" in the last 8760 hours. HbA1C: No results for input(s): "HGBA1C" in the last 72 hours. CBG: No results for input(s): "GLUCAP" in the last 168 hours. Lipid Profile: No results for input(s): "CHOL", "HDL", "LDLCALC", "TRIG", "CHOLHDL", "LDLDIRECT" in the last 72 hours. Thyroid Function Tests: No results for input(s): "TSH", "T4TOTAL", "FREET4", "T3FREE", "THYROIDAB" in the last 72 hours. Anemia Panel: No results for input(s): "VITAMINB12", "FOLATE", "FERRITIN", "TIBC", "IRON", "RETICCTPCT" in the last 72 hours. Urine analysis:    Component Value Date/Time   COLORURINE YELLOW (A) 07/02/2019 0317   APPEARANCEUR CLOUDY (A) 07/02/2019 0317   LABSPEC 1.017 07/02/2019 0317   PHURINE 5.0 07/02/2019 0317   GLUCOSEU >=500 (A) 07/02/2019 0317   HGBUR LARGE (A) 07/02/2019 0317   BILIRUBINUR Negative 10/21/2021 1111   KETONESUR 20 (A) 07/02/2019 0317   PROTEINUR Negative 10/21/2021 1111   PROTEINUR 100 (A) 07/02/2019 0317   UROBILINOGEN 0.2 10/21/2021 1111   NITRITE Negative 10/21/2021 1111   NITRITE NEGATIVE 07/02/2019 0317   LEUKOCYTESUR Large (3+) (A) 10/21/2021 1111   LEUKOCYTESUR LARGE (A) 07/02/2019 0317   Sepsis Labs: @LABRCNTIP (procalcitonin:4,lacticidven:4) )No results found for this or any previous visit (from the past 240 hour(s)).   Radiological Exams on Admission: DG Toe Great Left  Result Date: 01/13/2023 CLINICAL DATA:  possible infection EXAM: LEFT GREAT TOE 3 VIEW COMPARISON:  None Available. FINDINGS: There is no evidence of fracture or dislocation. There is subcutaneous gas in the soft tissues overlying the  great toe proximal and distal phalanx. No definite evidence of cortical erosion at this time to suggest osteomyelitis. No radiopaque foreign body. IMPRESSION: Subcutaneous gas in the soft tissues overlying the great toe proximal and distal phalanx, concerning for infection. No definite evidence of cortical erosion at this time to definitively suggest osteomyelitis. Electronically Signed   By: Lorenza Cambridge M.D.   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  X-ray showed subcutaneous gas in the soft tissue.  - will admit to med-surg bed as inpatient - Empiric antimicrobial treatment with vancomycin, Flagyl, Rocephin - PRN Zofran for nausea, tylenol and Percocet for pain - Blood cultures x 2  - ESR and CRP - INR/PTT - Consulted Dr. Ether Griffins of podiatry and Dr. Gilda Crease for [facility].  Type 2 diabetes mellitus with complication of diabetic foot ulcer (HCC): Recent A1c 10.1, poorly controlled.  Patient is taking metformin and Levemir 35 units twice daily -Sliding scale insulin -Glargine insulin 28 units twice daily   Primary hypertension -IV hydralazine as needed -Diovan-HCTZ -Amlodipine  HLD (hyperlipidemia): Patient is currently not taking her lovastatin -Follow-up with PCP  Hypokalemia and hypomagnesemia: Potassium 2.9 and magnesium 1.6 -Repleted potassium and magnesium -Check phosphorus level  Asthma: Stable -Bronchodilators and as needed Mucinex      DVT ppx: SQ Heparin    Code Status: Full code     Family Communication:    Yes, patient's sister  at bed side.   Disposition Plan:  Anticipate  discharge back to previous environment  Consults called:  Dr. Ether Griffins of podiatry and Dr. Gilda Crease of [facility] are consulted.  Admission status and Level of care: Med-Surg:     as inpt      Dispo: The patient is from: Home              Anticipated d/c is to: Home              Anticipated d/c date is: 2 days              Patient currently is not medically stable to d/c.    Severity of Illness:  The appropriate patient status for this patient is INPATIENT. Inpatient status is judged to be reasonable and necessary in order to provide the required intensity of service to ensure the patient's safety. The patient's presenting symptoms, physical exam findings, and initial radiographic and laboratory data in the context of their chronic comorbidities is felt to place them at high risk for further clinical deterioration. Furthermore, it is not anticipated that the patient will be medically stable for discharge from the hospital within 2 midnights of admission.   * I certify that at the point of admission it is my clinical judgment that the patient will require inpatient hospital care spanning beyond 2 midnights from the point of admission due to high intensity of service, high risk for further deterioration and high frequency of surveillance required.*       Date of Service 01/13/2023    Lorretta Harp [facility]   If 7PM-7AM, please contact night-coverage [email or web address] 01/13/2023, 5:20 PM

## 2023-01-13 NOTE — ED Notes (Signed)
Pt and sister now stating that pt is not going to stay even though has ready bed upstairs and is admitted and that her kids "they don't think I need an amputation" and "I'm just going to go to [facility]". Explained that she would have to wait there again and already has ready bed here, also that she is at risk for worse infx and sepsis. ED secretary notified to page hospitalist to have him call her children.

## 2023-01-13 NOTE — ED Notes (Signed)
Spoke with Dr Clyde Lundborg on phone. Dr Clyde Lundborg reached son Benji who hung up on him. Dr Clyde Lundborg now on phone with daughter who just arrived at bedside.

## 2023-01-13 NOTE — ED Notes (Signed)
Dr Clyde Lundborg and Dr Ether Griffins aware of situation and are trying to reach family.

## 2023-01-13 NOTE — ED Triage Notes (Signed)
Pt comes with c/o left big toe pain for about 3 weeks. Pt has bandage in place. Pt was sent over by MD Clide Cliff. Pt states she thinks it is getting worse. Pt is diabetic.

## 2023-01-14 ENCOUNTER — Ambulatory Visit: Admit: 2023-01-14 | Payer: Medicare Other | Admitting: Vascular Surgery

## 2023-01-14 SURGERY — LOWER EXTREMITY ANGIOGRAPHY
Anesthesia: Moderate Sedation | Laterality: Left

## 2023-01-15 LAB — CULTURE, BLOOD (ROUTINE X 2): Special Requests: ADEQUATE

## 2023-01-16 LAB — CULTURE, BLOOD (ROUTINE X 2)

## 2023-01-17 LAB — CULTURE, BLOOD (ROUTINE X 2): Culture: NO GROWTH

## 2023-01-18 LAB — CULTURE, BLOOD (ROUTINE X 2): Culture: NO GROWTH

## 2023-02-21 ENCOUNTER — Emergency Department: Payer: Medicare Other

## 2023-02-21 ENCOUNTER — Other Ambulatory Visit: Payer: Self-pay

## 2023-02-21 DIAGNOSIS — Y92002 Bathroom of unspecified non-institutional (private) residence single-family (private) house as the place of occurrence of the external cause: Secondary | ICD-10-CM | POA: Diagnosis not present

## 2023-02-21 DIAGNOSIS — W19XXXA Unspecified fall, initial encounter: Secondary | ICD-10-CM | POA: Diagnosis not present

## 2023-02-21 DIAGNOSIS — M25561 Pain in right knee: Secondary | ICD-10-CM | POA: Diagnosis not present

## 2023-02-21 DIAGNOSIS — M25562 Pain in left knee: Secondary | ICD-10-CM | POA: Diagnosis not present

## 2023-02-21 NOTE — ED Triage Notes (Signed)
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

## 2023-02-22 ENCOUNTER — Emergency Department
Admission: EM | Admit: 2023-02-22 | Discharge: 2023-02-22 | Disposition: A | Discharge status: Home / Self Care | Payer: Medicare Other | Attending: Emergency Medicine | Admitting: Emergency Medicine

## 2023-02-22 DIAGNOSIS — M25561 Pain in right knee: Secondary | ICD-10-CM

## 2023-02-22 DIAGNOSIS — W19XXXA Unspecified fall, initial encounter: Secondary | ICD-10-CM

## 2023-02-22 MED ORDER — DICLOFENAC SODIUM 1 % EX GEL: 2.0000 g | Freq: Four times a day (QID) | CUTANEOUS | 0 refills | Status: DC | PRN

## 2023-02-22 NOTE — ED Provider Notes (Signed)
[facility] Provider Note    Event Date/Time   First MD Initiated Contact with Patient 02/22/23 0207     (approximate)   History   Fall, knee pain   HPI  Mary Hodges is a 82 y.o. female  who presents to the emergency department today because of concern for bilateral knee pain after a fall. The patient recently underwent toe amputation so is having a hard time adjusting her ambulation for that reason. Apparently she was in the bathroom when she was trying to pull up her underwear and fall unto her knees. States she lost her balance.  No chest pain, palpitations.  Did not feel lightheaded.     Physical Exam   Triage Vital Signs: ED Triage Vitals  Encounter Vitals Group     BP 02/21/23 2306 (!) 152/77     Systolic BP Percentile --      Diastolic BP Percentile --      Pulse Rate 02/21/23 2306 80     Resp 02/21/23 2306 16     Temp 02/21/23 2306 98.7 F (37.1 C)     Temp Source 02/21/23 2306 Oral     SpO2 02/21/23 2306 99 %     Weight --      Height --      Head Circumference --      Peak Flow --      Pain Score 02/21/23 2307 10     Pain Loc --      Pain Education --      Exclude from Growth Chart --     Most recent vital signs: Vitals:   02/21/23 2306  BP: (!) 152/77  Pulse: 80  Resp: 16  Temp: 98.7 F (37.1 C)  SpO2: 99%   General: Sleeping, but awakens to verbal stimuli CV:  Good peripheral perfusion.  Resp:  Normal effort.  Abd:  No distention.  Other:  Bilateral knees without effusion or bruising, no deformity. No hip tenderness.    ED Results / Procedures / Treatments   Labs (all labs ordered are listed, but only abnormal results are displayed) Labs Reviewed - No data to display   EKG  None   RADIOLOGY I independently interpreted and visualized the right knee. My interpretation: No fracture Radiology interpretation:  IMPRESSION:  Degenerative change without acute abnormality.    I independently interpreted and  visualized the left knee. My interpretation: No fracture Radiology interpretation:  IMPRESSION:  Degenerative change without acute abnormality.      PROCEDURES:  Critical Care performed: No   MEDICATIONS ORDERED IN ED: Medications - No data to display   IMPRESSION / MDM / ASSESSMENT AND PLAN / ED COURSE  I reviewed the triage vital signs and the nursing notes.                              Differential diagnosis includes, but is not limited to, fracture, dislocation, contusion  Patient's presentation is most consistent with acute presentation with potential threat to life or bodily function.   Patient presented to the emergency department today with concerns for bilateral knee pain after a fall.  X-rays without any fracture or dislocation.  Patient denies any chest pain palpitations or dizziness.  Patient family believe this fall was secondary to toe amputation.  Given lack of other symptoms that this is a reasonable explanation.  Brought up possibility of checking blood work however family did  not feel that was necessary.  Will plan on discharging.   FINAL CLINICAL IMPRESSION(S) / ED DIAGNOSES   Final diagnoses:  Fall, initial encounter  Acute pain of both knees     Note:  This document was prepared using Dragon voice recognition software and may include unintentional dictation errors.    Phineas Semen, MD 02/22/23 [phone]

## 2023-04-24 ENCOUNTER — Other Ambulatory Visit: Payer: Self-pay | Admitting: Internal Medicine

## 2023-07-21 ENCOUNTER — Emergency Department
Admission: EM | Admit: 2023-07-21 | Discharge: 2023-08-12 | Disposition: E | Payer: Medicare Other | Attending: Emergency Medicine | Admitting: Emergency Medicine

## 2023-07-21 DIAGNOSIS — R464 Slowness and poor responsiveness: Secondary | ICD-10-CM | POA: Diagnosis present

## 2023-07-21 DIAGNOSIS — I469 Cardiac arrest, cause unspecified: Secondary | ICD-10-CM | POA: Diagnosis not present

## 2023-07-21 DIAGNOSIS — R569 Unspecified convulsions: Secondary | ICD-10-CM | POA: Insufficient documentation

## 2023-07-21 LAB — CBG MONITORING, ED: Glucose-Capillary: 307 mg/dL — ABNORMAL HIGH (ref 70–99)

## 2023-07-21 MED ORDER — EPINEPHRINE 1 MG/10ML IJ SOSY
PREFILLED_SYRINGE | INTRAMUSCULAR | Status: DC | PRN
  Administered 2023-07-21 (×3): 1 mg via INTRAVENOUS

## 2023-08-12 NOTE — Code Documentation (Signed)
Pulse check, no pulse, CPR resumed

## 2023-08-12 NOTE — Code Documentation (Signed)
Pulse check, no pulse, pt remains in PEA, CPR resumed.

## 2023-08-12 NOTE — Code Documentation (Signed)
CPR discontinued. Asystole on the monitor.

## 2023-08-12 NOTE — Code Documentation (Signed)
Pulse check, no pulse, cpr resumed 

## 2023-08-12 NOTE — Code Documentation (Signed)
Pulse check, PEA, CPR resumed  

## 2023-08-12 NOTE — ED Provider Notes (Signed)
Department of Emergency Medicine   Code Blue CONSULT NOTE  Chief Complaint: Cardiac arrest  Level V Caveat: Unresponsive  History of present illness:  Patient per EMS was witnessed to have a possible seizure at her nursing facility.  EMS was transporting the patient noted low heart rate patient started to complain of chest pain, they were planning to perform transcutaneous pacing when the patient lost pulses and arrested.  EMS advised they have not yet been able to start any cardiac medication.  The patient has been in PEA with CPR initiated not long before arrival  ROS: Unable to obtain, Level V caveat Social History   Socioeconomic History   Marital status: Widowed    Spouse name: Not on file   Number of children: Not on file   Years of education: Not on file   Highest education level: Not on file  Occupational History   Not on file  Tobacco Use   Smoking status: Never   Smokeless tobacco: Never  Vaping Use   Vaping status: Never Used  Substance and Sexual Activity   Alcohol use: No    Alcohol/week: 0.0 standard drinks of alcohol   Drug use: No   Sexual activity: Not on file  Other Topics Concern   Not on file  Social History Narrative   Not on file   Social Determinants of Health   Financial Resource Strain: Low Risk  (05/18/2023)   Received from [facility]   Overall Financial Resource Strain (CARDIA)    Difficulty of Paying Living Expenses: Not very hard  Recent Concern: Financial Resource Strain - High Risk (03/30/2023)   Received from [facility]   Overall Financial Resource Strain (CARDIA)    Difficulty of Paying Living Expenses: Very hard  Food Insecurity: No Food Insecurity (03/30/2023)   Received from [facility]   Hunger Vital Sign    Worried About Running Out of Food in the Last Year: Never true    Ran Out of Food in the Last Year: Never true  Transportation Needs: No Transportation Needs (03/30/2023)    Received from [facility] - Transportation    In the past 12 months, has lack of transportation kept you from medical appointments or from getting medications?: No    Lack of Transportation (Non-Medical): No  Physical Activity: Inactive (07/23/2021)   Exercise Vital Sign    Days of Exercise per Week: 0 days    Minutes of Exercise per Session: 0 min  Stress: No Stress Concern Present (07/23/2021)   [employer] of Occupational Health - Occupational Stress Questionnaire    Feeling of Stress : Not at all  Social Connections: Moderately Integrated (07/23/2021)   Social Connection and Isolation Panel [NHANES]    Frequency of Communication with Friends and Family: More than three times a week    Frequency of Social Gatherings with Friends and Family: More than three times a week    Attends Religious Services: More than 4 times per year    Active Member of [employer] or Organizations: Yes    Attends Banker Meetings: More than 4 times per year    Marital Status: Widowed  Intimate Partner Violence: Not At Risk (07/23/2021)   Humiliation, Afraid, Rape, and Kick questionnaire    Fear of Current or Ex-Partner: No    Emotionally Abused: No    Physically Abused: No    Sexually Abused: No   No Known Allergies  Last set of Vital Signs (not current) There were no vitals filed for this visit.    Physical Exam  Cardiac pads demonstrate PEA at a slow rate wide underlying rhythm  Gen: unresponsive Cardiovascular: pulseless  Resp: apneic.  Chest rise and fall equally with bagging Abd: nondistended  Neuro: GCS 3, unresponsive to pain  During video laryngoscopy it is noted that the patient has large amount of gastric secretions and food particles from the esophagus.  These were suctioned out with good effect HEENT: No blood in posterior pharynx, gag reflex absent  Neck: No crepitus  Musculoskeletal: No deformity  Skin: warm centrally cool  peripherally  Procedure Name: Intubation Date/Time: 07-26-2023 12:52 PM  Performed by: Sharyn Creamer, MDPre-anesthesia Checklist: Patient identified, Emergency Drugs available, Suction available and Patient being monitored Oxygen Delivery Method: Ambu bag Preoxygenation: Pre-oxygenation with 100% oxygen Induction Type: IV induction and Rapid sequence Ventilation: Mask ventilation without difficulty Laryngoscope Size: Glidescope and 3 Grade View: Grade II Tube type: Subglottic suction tube Tube size: 7.5 mm Number of attempts: 2 Airway Equipment and Method: Video-laryngoscopy Placement Confirmation: ETT inserted through vocal cords under direct vision, CO2 detector and Breath sounds checked- equal and bilateral Tube secured with: ETT holder Dental Injury: Teeth and Oropharynx as per pre-operative assessment  Comments: Color change capnography present on several breaths.  Then placed on continuous capnography with good waveform approximately 17-20.  Patient did require suctioning after first attempt at intubation due to large amount of gastric secretions.  Patient was able to be quickly intubated thereafter without complication on second attempt after significant suctioning/removal of food particles.       CRITICAL CARE Performed by: Sharyn Creamer Total critical care time: 40 Critical care time was exclusive of separately billable procedures and treating other patients. Critical care was necessary to treat or prevent imminent or life-threatening deterioration. Critical care was time spent personally by me on the following activities: development of treatment plan with patient and/or surrogate as well as nursing, discussions with consultants, evaluation of patient's response to treatment, examination of patient, obtaining history from patient or surrogate, ordering and performing treatments and interventions, ordering and review of laboratory studies, ordering and review of radiographic  studies, pulse oximetry and re-evaluation of patient's condition.  Cardiopulmonary Resuscitation (CPR) Procedure Note  Directed/Performed by: Sharyn Creamer I personally directed ancillary staff and/or performed CPR in an effort to regain return of spontaneous circulation and to maintain cardiac, neuro and systemic perfusion.    Medical Decision making  Patient presents with abrupt onset of symptoms of possible "seizure" followed by bradycardia and then cardiac arrest.  EMS EKG shows ventricular escape type rhythm that was transmitted, unclear if STEMI but due to the patient's condition active CPR and that the Cath Lab currently occupied by another case, decision made not to activate as a code STEMI at this time.  I do appreciate, and Dr. Juliann Pares came from cardiology to evaluate if needed, determined cardiology consultation not needed as patient currently an active arrest  Assessment and Plan  Cardiac arrest.  Reviewed potential causes including H's and T's.  Per history does not have a history of acute renal failure, no history of suspected acute hyperkalemia, there is a high concern for potential underlying acute cardiac cause especially given that the patient had a "seizure" followed by an acute cardiac event with bradycardia.  CPR conducted for multiple rounds with effective compressions, Lucas device, cardiac medications for treatment of PEA including epinephrine, and after approximately 25 to 30  minutes of CPR, discussed with the patient's family.  I attempted to contact the patient's son, Sharlet Salina.  I left a voicemail he did return call at approximately 12:50 PM, at which point he was apologetic for how he reacted to the news of her death.  He advised that he is now much calmer and understanding.   The patient's daughter, as well as her husband.  Houston Siren arrived to the ED, and after entering the resuscitation room and discussing with me as well as her mother's grave prognosis, medical  decision made to initiate DNR status.  Patient was transition to DNR, within approximately 20 seconds of discontinuing chest compressions she entered asystolic rhythm.  Bedside cardiac ultrasound demonstrates no cardiac activity, rhythm strip demonstrating aystole.  Fixed pupils.  No spontaneous respirations.  No pulses.  Death declared at approximately 1225.  Patient does have a primary care provider.  Medical examiner being contacted by ED nursing staff  Patient has primary care Dr. Pearline Cables (for consideration for certification of death)   Sharyn Creamer, MD 2023/08/17 1300

## 2023-08-12 NOTE — Code Documentation (Signed)
Pulse check, no pulse palpated, CPR resumed  

## 2023-08-12 NOTE — Code Documentation (Signed)
Pulse check, no pulse, pea, CPR restarted.

## 2023-08-12 NOTE — Code Documentation (Signed)
Pulse check, PEA, cpr resumed

## 2023-08-12 NOTE — Code Documentation (Signed)
Pt intubated at this time.   

## 2023-08-12 NOTE — Code Documentation (Signed)
Daughter to the bedside

## 2023-08-12 NOTE — Code Documentation (Signed)
TOD called at this time.

## 2023-08-12 NOTE — ED Triage Notes (Signed)
Pt bib ems from [facility]. Initial call for seizure, no hx.on arrival pt HR 70, BP 80 systolic. When pt came to, pt complained of chest pain. En route pt became bradycardic and had worsening bradycardia. EMS attempted to pace, pt became agonal and lost pulses en route. CPR started by ems.

## 2023-08-12 NOTE — Code Documentation (Signed)
Pulse check, pea, CPR resumed

## 2023-08-12 NOTE — Progress Notes (Signed)
Responded to page for patient passing. Introduced self to family, offered compassionate presence and listening ear. Family shared no needs at this time.

## 2023-08-12 DEATH — deceased
